# Patient Record
Sex: Female | Born: 1935 | Race: White | Hispanic: No | Marital: Married | Smoking: Never smoker
Health system: Southern US, Community
[De-identification: ages and names within clinical notes are randomized; demographics above are authoritative.]

## PROBLEM LIST (undated history)

## (undated) DIAGNOSIS — E785 Hyperlipidemia, unspecified: Secondary | ICD-10-CM

## (undated) DIAGNOSIS — G43909 Migraine, unspecified, not intractable, without status migrainosus: Secondary | ICD-10-CM

## (undated) HISTORY — DX: Hyperlipidemia, unspecified: E78.5

## (undated) HISTORY — PX: BLEPHAROPLASTY: SUR158

## (undated) HISTORY — DX: Migraine, unspecified, not intractable, without status migrainosus: G43.909

---

## 2000-10-04 LAB — HM COLONOSCOPY

## 2002-03-12 ENCOUNTER — Encounter: Payer: Self-pay | Admitting: Internal Medicine

## 2002-03-12 ENCOUNTER — Encounter: Admission: RE | Admit: 2002-03-12 | Discharge: 2002-03-12 | Payer: Self-pay | Admitting: Internal Medicine

## 2003-02-26 ENCOUNTER — Encounter: Payer: Self-pay | Admitting: Internal Medicine

## 2003-02-26 ENCOUNTER — Encounter: Admission: RE | Admit: 2003-02-26 | Discharge: 2003-02-26 | Payer: Self-pay | Admitting: Internal Medicine

## 2003-03-05 ENCOUNTER — Other Ambulatory Visit: Admission: RE | Admit: 2003-03-05 | Discharge: 2003-03-05 | Payer: Self-pay | Admitting: Internal Medicine

## 2003-08-26 ENCOUNTER — Ambulatory Visit (HOSPITAL_BASED_OUTPATIENT_CLINIC_OR_DEPARTMENT_OTHER): Admission: RE | Admit: 2003-08-26 | Discharge: 2003-08-26 | Payer: Self-pay | Admitting: Orthopedic Surgery

## 2004-04-11 ENCOUNTER — Encounter: Admission: RE | Admit: 2004-04-11 | Discharge: 2004-04-11 | Payer: Self-pay | Admitting: Internal Medicine

## 2004-07-20 ENCOUNTER — Ambulatory Visit (HOSPITAL_BASED_OUTPATIENT_CLINIC_OR_DEPARTMENT_OTHER): Admission: RE | Admit: 2004-07-20 | Discharge: 2004-07-20 | Payer: Self-pay | Admitting: Orthopedic Surgery

## 2004-10-12 ENCOUNTER — Ambulatory Visit: Payer: Self-pay | Admitting: Internal Medicine

## 2005-04-13 ENCOUNTER — Encounter: Admission: RE | Admit: 2005-04-13 | Discharge: 2005-04-13 | Payer: Self-pay | Admitting: Obstetrics and Gynecology

## 2005-04-25 ENCOUNTER — Encounter: Admission: RE | Admit: 2005-04-25 | Discharge: 2005-04-25 | Payer: Self-pay | Admitting: Obstetrics and Gynecology

## 2005-05-11 ENCOUNTER — Ambulatory Visit: Payer: Self-pay | Admitting: Family Medicine

## 2005-10-31 ENCOUNTER — Ambulatory Visit: Payer: Self-pay | Admitting: Family Medicine

## 2006-02-05 ENCOUNTER — Ambulatory Visit: Payer: Self-pay | Admitting: Family Medicine

## 2006-04-19 ENCOUNTER — Encounter: Admission: RE | Admit: 2006-04-19 | Discharge: 2006-04-19 | Payer: Self-pay | Admitting: Obstetrics and Gynecology

## 2006-05-30 ENCOUNTER — Ambulatory Visit: Payer: Self-pay | Admitting: Family Medicine

## 2006-07-27 ENCOUNTER — Ambulatory Visit: Payer: Self-pay | Admitting: Family Medicine

## 2006-07-27 LAB — CONVERTED CEMR LAB
ALT: 23 units/L (ref 0–40)
AST: 25 units/L (ref 0–37)
BUN: 14 mg/dL (ref 6–23)
Basophils Absolute: 0 10*3/uL (ref 0.0–0.1)
CO2: 29 meq/L (ref 19–32)
Calcium: 10.4 mg/dL (ref 8.4–10.5)
Creatinine, Ser: 0.9 mg/dL (ref 0.4–1.2)
Eosinophil percent: 1.4 % (ref 0.0–5.0)
Glomerular Filtration Rate, Af Am: 80 mL/min/{1.73_m2}
Lymphocytes Relative: 35.1 % (ref 12.0–46.0)
MCHC: 33.3 g/dL (ref 30.0–36.0)
MCV: 95.5 fL (ref 78.0–100.0)
Neutro Abs: 2.5 10*3/uL (ref 1.4–7.7)
Neutrophils Relative %: 56.2 % (ref 43.0–77.0)
Platelets: 272 10*3/uL (ref 150–400)
Potassium: 4 meq/L (ref 3.5–5.1)
RDW: 12.7 % (ref 11.5–14.6)
Sodium: 141 meq/L (ref 135–145)
Total Protein: 7.1 g/dL (ref 6.0–8.3)

## 2006-10-19 ENCOUNTER — Ambulatory Visit: Payer: Self-pay | Admitting: Family Medicine

## 2006-10-22 ENCOUNTER — Encounter: Admission: RE | Admit: 2006-10-22 | Discharge: 2006-10-22 | Payer: Self-pay | Admitting: Family Medicine

## 2006-10-31 ENCOUNTER — Ambulatory Visit: Payer: Self-pay | Admitting: Family Medicine

## 2006-10-31 LAB — CONVERTED CEMR LAB
ALT: 22 units/L (ref 0–40)
Albumin: 3.9 g/dL (ref 3.5–5.2)
CO2: 31 meq/L (ref 19–32)
Calcium: 9.9 mg/dL (ref 8.4–10.5)
Creatinine, Ser: 0.8 mg/dL (ref 0.4–1.2)
GFR calc Af Amer: 92 mL/min
GFR calc non Af Amer: 76 mL/min
Glucose, Bld: 96 mg/dL (ref 70–99)
Sodium: 144 meq/L (ref 135–145)
Total Bilirubin: 1 mg/dL (ref 0.3–1.2)

## 2006-11-20 ENCOUNTER — Encounter: Admission: RE | Admit: 2006-11-20 | Discharge: 2006-11-20 | Payer: Self-pay | Admitting: Family Medicine

## 2006-12-07 ENCOUNTER — Ambulatory Visit: Payer: Self-pay | Admitting: Family Medicine

## 2006-12-10 ENCOUNTER — Ambulatory Visit: Payer: Self-pay | Admitting: Cardiology

## 2006-12-10 ENCOUNTER — Ambulatory Visit: Payer: Self-pay | Admitting: Internal Medicine

## 2006-12-10 LAB — CONVERTED CEMR LAB
Protein, ur: NEGATIVE mg/dL
Urobilinogen, UA: 0.2 (ref 0.0–1.0)
pH: 6 (ref 5.0–8.0)

## 2007-01-21 DIAGNOSIS — G43909 Migraine, unspecified, not intractable, without status migrainosus: Secondary | ICD-10-CM | POA: Insufficient documentation

## 2007-01-29 ENCOUNTER — Ambulatory Visit: Payer: Self-pay | Admitting: Family Medicine

## 2007-01-29 DIAGNOSIS — D179 Benign lipomatous neoplasm, unspecified: Secondary | ICD-10-CM | POA: Insufficient documentation

## 2007-02-11 ENCOUNTER — Telehealth (INDEPENDENT_AMBULATORY_CARE_PROVIDER_SITE_OTHER): Payer: Self-pay | Admitting: *Deleted

## 2007-02-19 ENCOUNTER — Ambulatory Visit: Payer: Self-pay | Admitting: Family Medicine

## 2007-02-20 ENCOUNTER — Encounter: Payer: Self-pay | Admitting: Family Medicine

## 2007-03-20 ENCOUNTER — Telehealth (INDEPENDENT_AMBULATORY_CARE_PROVIDER_SITE_OTHER): Payer: Self-pay | Admitting: *Deleted

## 2007-04-04 ENCOUNTER — Encounter: Payer: Self-pay | Admitting: Internal Medicine

## 2007-04-24 ENCOUNTER — Encounter: Admission: RE | Admit: 2007-04-24 | Discharge: 2007-04-24 | Payer: Self-pay | Admitting: Obstetrics and Gynecology

## 2007-04-24 ENCOUNTER — Encounter: Payer: Self-pay | Admitting: Family Medicine

## 2007-05-02 ENCOUNTER — Encounter (INDEPENDENT_AMBULATORY_CARE_PROVIDER_SITE_OTHER): Payer: Self-pay | Admitting: *Deleted

## 2007-07-31 ENCOUNTER — Telehealth (INDEPENDENT_AMBULATORY_CARE_PROVIDER_SITE_OTHER): Payer: Self-pay | Admitting: *Deleted

## 2007-08-01 ENCOUNTER — Telehealth (INDEPENDENT_AMBULATORY_CARE_PROVIDER_SITE_OTHER): Payer: Self-pay | Admitting: *Deleted

## 2007-08-26 ENCOUNTER — Ambulatory Visit: Payer: Self-pay | Admitting: Family Medicine

## 2007-08-26 ENCOUNTER — Encounter (INDEPENDENT_AMBULATORY_CARE_PROVIDER_SITE_OTHER): Payer: Self-pay | Admitting: *Deleted

## 2007-09-10 ENCOUNTER — Telehealth (INDEPENDENT_AMBULATORY_CARE_PROVIDER_SITE_OTHER): Payer: Self-pay | Admitting: *Deleted

## 2007-11-05 ENCOUNTER — Ambulatory Visit: Payer: Self-pay | Admitting: Family Medicine

## 2007-11-05 DIAGNOSIS — E785 Hyperlipidemia, unspecified: Secondary | ICD-10-CM

## 2007-11-08 ENCOUNTER — Encounter (INDEPENDENT_AMBULATORY_CARE_PROVIDER_SITE_OTHER): Payer: Self-pay | Admitting: *Deleted

## 2007-11-18 ENCOUNTER — Encounter (INDEPENDENT_AMBULATORY_CARE_PROVIDER_SITE_OTHER): Payer: Self-pay | Admitting: *Deleted

## 2008-02-14 ENCOUNTER — Telehealth (INDEPENDENT_AMBULATORY_CARE_PROVIDER_SITE_OTHER): Payer: Self-pay | Admitting: *Deleted

## 2008-02-14 ENCOUNTER — Ambulatory Visit: Payer: Self-pay | Admitting: Family Medicine

## 2008-02-17 ENCOUNTER — Encounter (INDEPENDENT_AMBULATORY_CARE_PROVIDER_SITE_OTHER): Payer: Self-pay | Admitting: *Deleted

## 2008-02-18 ENCOUNTER — Encounter: Payer: Self-pay | Admitting: Family Medicine

## 2008-02-24 ENCOUNTER — Encounter (INDEPENDENT_AMBULATORY_CARE_PROVIDER_SITE_OTHER): Payer: Self-pay | Admitting: *Deleted

## 2008-04-29 ENCOUNTER — Encounter: Admission: RE | Admit: 2008-04-29 | Discharge: 2008-04-29 | Payer: Self-pay | Admitting: Obstetrics and Gynecology

## 2008-05-28 ENCOUNTER — Ambulatory Visit: Payer: Self-pay | Admitting: Family Medicine

## 2008-05-29 ENCOUNTER — Encounter: Payer: Self-pay | Admitting: Family Medicine

## 2008-06-01 ENCOUNTER — Encounter (INDEPENDENT_AMBULATORY_CARE_PROVIDER_SITE_OTHER): Payer: Self-pay | Admitting: *Deleted

## 2008-06-01 LAB — CONVERTED CEMR LAB
AST: 24 units/L (ref 0–37)
Albumin: 4 g/dL (ref 3.5–5.2)

## 2008-06-02 ENCOUNTER — Encounter: Payer: Self-pay | Admitting: Family Medicine

## 2008-06-11 ENCOUNTER — Encounter (INDEPENDENT_AMBULATORY_CARE_PROVIDER_SITE_OTHER): Payer: Self-pay | Admitting: *Deleted

## 2008-11-06 ENCOUNTER — Ambulatory Visit: Payer: Self-pay | Admitting: Family Medicine

## 2008-11-09 ENCOUNTER — Encounter (INDEPENDENT_AMBULATORY_CARE_PROVIDER_SITE_OTHER): Payer: Self-pay | Admitting: *Deleted

## 2009-02-17 ENCOUNTER — Ambulatory Visit: Payer: Self-pay | Admitting: Family Medicine

## 2009-02-17 DIAGNOSIS — M533 Sacrococcygeal disorders, not elsewhere classified: Secondary | ICD-10-CM | POA: Insufficient documentation

## 2009-02-18 ENCOUNTER — Encounter: Payer: Self-pay | Admitting: Family Medicine

## 2009-05-25 ENCOUNTER — Encounter: Admission: RE | Admit: 2009-05-25 | Discharge: 2009-05-25 | Payer: Self-pay | Admitting: Obstetrics and Gynecology

## 2009-06-14 ENCOUNTER — Ambulatory Visit: Payer: Self-pay | Admitting: Family Medicine

## 2009-06-16 ENCOUNTER — Encounter: Payer: Self-pay | Admitting: Family Medicine

## 2009-06-16 LAB — CONVERTED CEMR LAB
Alkaline Phosphatase: 73 units/L (ref 39–117)
LDL Cholesterol: 66 mg/dL (ref 0–99)
Total Bilirubin: 1 mg/dL (ref 0.3–1.2)
Total CHOL/HDL Ratio: 2

## 2009-10-05 ENCOUNTER — Telehealth: Payer: Self-pay | Admitting: Family Medicine

## 2009-11-10 ENCOUNTER — Ambulatory Visit: Payer: Self-pay | Admitting: Family Medicine

## 2009-11-10 ENCOUNTER — Encounter (INDEPENDENT_AMBULATORY_CARE_PROVIDER_SITE_OTHER): Payer: Self-pay | Admitting: *Deleted

## 2009-11-10 DIAGNOSIS — I498 Other specified cardiac arrhythmias: Secondary | ICD-10-CM

## 2009-11-16 ENCOUNTER — Ambulatory Visit: Payer: Self-pay | Admitting: Family Medicine

## 2010-03-22 ENCOUNTER — Ambulatory Visit (HOSPITAL_COMMUNITY): Admission: RE | Admit: 2010-03-22 | Discharge: 2010-03-22 | Payer: Self-pay | Admitting: Obstetrics and Gynecology

## 2010-03-23 ENCOUNTER — Telehealth (INDEPENDENT_AMBULATORY_CARE_PROVIDER_SITE_OTHER): Payer: Self-pay | Admitting: *Deleted

## 2010-03-23 DIAGNOSIS — Z780 Asymptomatic menopausal state: Secondary | ICD-10-CM | POA: Insufficient documentation

## 2010-05-25 ENCOUNTER — Ambulatory Visit: Payer: Self-pay | Admitting: Family Medicine

## 2010-05-27 ENCOUNTER — Encounter: Admission: RE | Admit: 2010-05-27 | Discharge: 2010-05-27 | Payer: Self-pay | Admitting: Obstetrics and Gynecology

## 2010-05-27 LAB — CONVERTED CEMR LAB
Albumin: 4.2 g/dL (ref 3.5–5.2)
Cholesterol: 162 mg/dL (ref 0–200)
Total Protein: 6.7 g/dL (ref 6.0–8.3)

## 2010-08-17 ENCOUNTER — Ambulatory Visit
Admission: RE | Admit: 2010-08-17 | Discharge: 2010-08-17 | Payer: Self-pay | Source: Home / Self Care | Attending: Family Medicine | Admitting: Family Medicine

## 2010-09-18 LAB — CONVERTED CEMR LAB
ALT: 19 units/L (ref 0–35)
ALT: 20 units/L (ref 0–35)
ALT: 20 units/L (ref 0–35)
AST: 22 units/L (ref 0–37)
AST: 24 units/L (ref 0–37)
AST: 24 units/L (ref 0–37)
Albumin: 3.9 g/dL (ref 3.5–5.2)
Albumin: 4 g/dL (ref 3.5–5.2)
Albumin: 4.2 g/dL (ref 3.5–5.2)
Albumin: 4.2 g/dL (ref 3.5–5.2)
Alkaline Phosphatase: 70 units/L (ref 39–117)
Alkaline Phosphatase: 70 units/L (ref 39–117)
Alkaline Phosphatase: 72 units/L (ref 39–117)
Alkaline Phosphatase: 79 units/L (ref 39–117)
Alkaline Phosphatase: 84 units/L (ref 39–117)
BUN: 12 mg/dL (ref 6–23)
BUN: 17 mg/dL (ref 6–23)
Basophils Absolute: 0 10*3/uL (ref 0.0–0.1)
Basophils Absolute: 0.1 10*3/uL (ref 0.0–0.1)
Basophils Relative: 0.6 % (ref 0.0–3.0)
Bilirubin, Direct: 0 mg/dL (ref 0.0–0.3)
Bilirubin, Direct: 0 mg/dL (ref 0.0–0.3)
Blood in Urine, dipstick: NEGATIVE
CO2: 30 meq/L (ref 19–32)
CO2: 31 meq/L (ref 19–32)
Calcium: 10.6 mg/dL — ABNORMAL HIGH (ref 8.4–10.5)
Chloride: 108 meq/L (ref 96–112)
Chloride: 108 meq/L (ref 96–112)
Cholesterol: 157 mg/dL (ref 0–200)
Creatinine, Ser: 0.8 mg/dL (ref 0.4–1.2)
Eosinophils Absolute: 0.1 10*3/uL (ref 0.0–0.7)
Eosinophils Relative: 0.9 % (ref 0.0–5.0)
Eosinophils Relative: 1.7 % (ref 0.0–5.0)
Free T4: 0.8 ng/dL (ref 0.6–1.6)
GFR calc non Af Amer: 75.27 mL/min (ref >60)
GFR calc non Af Amer: 76 mL/min
Glucose, Bld: 88 mg/dL (ref 70–99)
Glucose, Urine, Semiquant: NEGATIVE
HCT: 42.4 % (ref 36.0–46.0)
HCT: 44 % (ref 36.0–46.0)
HCT: 45.5 % (ref 36.0–46.0)
Hemoglobin: 14.8 g/dL (ref 12.0–15.0)
Hemoglobin: 14.9 g/dL (ref 12.0–15.0)
Hemoglobin: 14.9 g/dL (ref 12.0–15.0)
Ketones, urine, test strip: NEGATIVE
LDL Cholesterol: 69 mg/dL (ref 0–99)
Lymphocytes Relative: 33.1 % (ref 12.0–46.0)
Lymphocytes Relative: 34.1 % (ref 12.0–46.0)
Lymphs Abs: 1.6 10*3/uL (ref 0.7–4.0)
MCHC: 35 g/dL (ref 30.0–36.0)
MCV: 92.7 fL (ref 78.0–100.0)
MCV: 95.9 fL (ref 78.0–100.0)
Monocytes Absolute: 0.4 10*3/uL (ref 0.1–1.0)
Monocytes Relative: 7 % (ref 3.0–12.0)
Neutro Abs: 3.4 10*3/uL (ref 1.4–7.7)
Neutrophils Relative %: 56.6 % (ref 43.0–77.0)
Neutrophils Relative %: 57.6 % (ref 43.0–77.0)
Neutrophils Relative %: 62.8 % (ref 43.0–77.0)
Nitrite: NEGATIVE
Nitrite: NEGATIVE
Platelets: 250 10*3/uL (ref 150.0–400.0)
Platelets: 294 10*3/uL (ref 150–400)
Potassium: 4 meq/L (ref 3.5–5.1)
Potassium: 5.3 meq/L — ABNORMAL HIGH (ref 3.5–5.1)
Protein, U semiquant: NEGATIVE
RBC: 4.74 M/uL (ref 3.87–5.11)
RDW: 12.2 % (ref 11.5–14.6)
Specific Gravity, Urine: <1.005
T3, Free: 2.7 pg/mL (ref 2.3–4.2)
TSH: 1.23 microintl units/mL (ref 0.35–5.50)
Total Bilirubin: 0.5 mg/dL (ref 0.3–1.2)
Total Bilirubin: 0.7 mg/dL (ref 0.3–1.2)
Total Bilirubin: 1.1 mg/dL (ref 0.3–1.2)
Triglycerides: 67 mg/dL (ref 0.0–149.0)
Urobilinogen, UA: NEGATIVE
WBC Urine, dipstick: NEGATIVE
WBC: 4.8 10*3/uL (ref 4.5–10.5)
WBC: 4.9 10*3/uL (ref 4.5–10.5)
WBC: 5.3 10*3/uL (ref 4.5–10.5)
pH: 7

## 2010-09-20 NOTE — Assessment & Plan Note (Signed)
Summary: CPX/KDC   Vital Signs:  Patient profile:   75 year old female Height:      67 inches Weight:      188 pounds BMI:     29.55 Temp:     97.9 degrees F oral Pulse rate:   70 / minute Pulse rhythm:   regular BP sitting:   126 / 74  (left arm) Cuff size:   regular  Vitals Entered By: Army Fossa CMA (November 10, 2009 8:40 AM) CC: Pt here for CPX- no complaints.    History of Present Illness: Pt here for cpe and labs.  No complaints.    Hyperlipidemia follow-up      This is a 75 year old woman who presents for Hyperlipidemia follow-up.  The patient denies muscle aches, GI upset, abdominal pain, flushing, itching, constipation, diarrhea, and fatigue.  The patient denies the following symptoms: chest pain/pressure, exercise intolerance, dypsnea, palpitations, syncope, and pedal edema.  Compliance with medications (by patient report) has been near 100%.  Dietary compliance has been good.  The patient reports exercising 3-4X per week.  Adjunctive measures currently used by the patient include ASA, fish oil supplements, limiting alcohol consumpton, and weight reduction.    Preventive Screening-Counseling & Management  Alcohol-Tobacco     Alcohol drinks/day: <1     Alcohol type: all     >5/day in last 3 mos: no     Alcohol Counseling: not indicated; use of alcohol is not excessive or problematic     Feels need to cut down: no     Feels annoyed by complaints: no     Feels guilty re: drinking: no     Needs 'eye opener' in am: no     Smoking Status: never     Passive Smoke Exposure: no  Caffeine-Diet-Exercise     Caffeine use/day: 2     Caffeine Counseling: not indicated; caffeine use is not excessive or problematic     Does Patient Exercise: yes     Type of exercise: aerobics and strength     Exercise (avg: min/session): 30-60     Times/week: 3     Exercise Counseling: not indicated; exercise is adequate  Hep-HIV-STD-Contraception     HIV Risk: no     Dental Visit-last  6 months yes     Dental Care Counseling: not indicated; dental care within six months     SBE monthly: no     SBE Education/Counseling: to perform regular SBE     Sun Exposure-Excessive: no     Sun Exposure Counseling: not indicated; sun exposure is acceptable  Safety-Violence-Falls     Seat Belt Use: yes     Seat Belt Counseling: not applicable     Firearms in the Home: no firearms in the home     Firearm Counseling: not applicable     Smoke Detectors: yes     Smoke Detector Counseling: no     Violence in the Home: no risk noted     Sexual Abuse: no     Fall Risk: no      Sexual History:  currently monogamous.    Current Medications (verified): 1)  Aspirin 81 Mg Tabs (Aspirin) 2)  Zocor 80 Mg Tabs (Simvastatin) .... Take One Tablet Daily 3)  A-Fish Oil  Caps (Vitamin A Caps) 4)  Glucosamine Sulfate 750 Mg Caps (Glucosamine Sulfate) 5)  Daily Multiple Vitamins  Tabs (Multiple Vitamin) 6)  Maxalt 10 Mg Tabs (Rizatriptan Benzoate) .... As Needed 7)  Claritin 10 Mg  Tabs (Loratadine) .Marland Kitchen.. 1 By Mouth Once Daily 8)  Tums Ultra 1000 1000 Mg Chew (Calcium Carbonate Antacid) .Marland Kitchen.. 1 By Mouth Once Daily  Allergies: 1)  ! Astelin (Azelastine Hcl)  Past History:  Past Medical History: Last updated: 11/05/2007 Migraines Hyperlipidemia  Family History: Last updated: 11/10/2009 Family History of Arthritis Family History Diabetes 1st degree relative Family History Hypertension Family History of Melanoma Family History of CAD Female 1st degree relative <50--- F MI 88 M-- died 27/73 yo--  GI bleed  Social History: Last updated: 11/10/2009 Never Smoked Alcohol use-yes Drug use-no Regular exercise-yes Retired  Risk Factors: Alcohol Use: <1 (11/10/2009) >5 drinks/d w/in last 3 months: no (11/10/2009) Caffeine Use: 2 (11/10/2009) Exercise: yes (11/10/2009)  Risk Factors: Smoking Status: never (11/10/2009) Passive Smoke Exposure: no (11/10/2009)  Past Surgical  History: Current Problems:  LIPOMA NOS (ICD-214.9) FAMILY HISTORY OF MELANOMA (ICD-V16.8) FAMILY HISTORY DIABETES 1ST DEGREE RELATIVE (ICD-V18.0) MIGRAINE HEADACHE (ICD-346.90)  blepharoplasty,  brow lift--06/25/2009--[facility]  Family History: Reviewed history from 11/05/2007 and no changes required. Family History of Arthritis Family History Diabetes 1st degree relative Family History Hypertension Family History of Melanoma Family History of CAD Female 1st degree relative <50--- F MI 56 M-- died 33/73 yo--  GI bleed  Social History: Reviewed history from 11/05/2007 and no changes required. Never Smoked Alcohol use-yes Drug use-no Regular exercise-yes Retired  Review of Systems      See HPI General:  Denies chills, fatigue, fever, loss of appetite, malaise, sleep disorder, sweats, weakness, and weight loss. Eyes:  Denies blurring, discharge, double vision, eye irritation, eye pain, halos, itching, light sensitivity, red eye, vision loss-1 eye, and vision loss-both eyes. ENT:  Denies decreased hearing, difficulty swallowing, ear discharge, earache, hoarseness, nasal congestion, nosebleeds, postnasal drainage, ringing in ears, sinus pressure, and sore throat; optho-q1y. CV:  Denies bluish discoloration of lips or nails, chest pain or discomfort, difficulty breathing at night, difficulty breathing while lying down, fainting, fatigue, leg cramps with exertion, lightheadness, near fainting, palpitations, shortness of breath with exertion, swelling of feet, swelling of hands, and weight gain. Resp:  Denies chest discomfort, chest pain with inspiration, cough, coughing up blood, excessive snoring, hypersomnolence, morning headaches, pleuritic, shortness of breath, sputum productive, and wheezing. GI:  Denies abdominal pain, bloody stools, change in bowel habits, constipation, dark tarry stools, diarrhea, excessive appetite, gas, hemorrhoids, indigestion, loss of appetite, nausea,  vomiting, vomiting blood, and yellowish skin color. GU:  Denies abnormal vaginal bleeding, decreased libido, discharge, dysuria, genital sores, hematuria, incontinence, nocturia, urinary frequency, and urinary hesitancy. MS:  Denies joint pain, joint redness, joint swelling, loss of strength, low back pain, mid back pain, muscle aches, muscle , cramps, muscle weakness, stiffness, and thoracic pain. Derm:  Denies changes in color of skin, changes in nail beds, dryness, excessive perspiration, flushing, hair loss, insect bite(s), itching, lesion(s), poor wound healing, and rash. Neuro:  Denies brief paralysis, difficulty with concentration, disturbances in coordination, falling down, headaches, inability to speak, memory loss, numbness, poor balance, seizures, sensation of room spinning, tingling, tremors, visual disturbances, and weakness. Psych:  Denies alternate hallucination ( auditory/visual), anxiety, depression, easily angered, easily tearful, irritability, mental problems, panic attacks, sense of great danger, suicidal thoughts/plans, thoughts of violence, unusual visions or sounds, and thoughts /plans of harming others. Endo:  Denies cold intolerance, excessive hunger, excessive thirst, excessive urination, heat intolerance, polyuria, and weight change. Heme:  Denies abnormal bruising, bleeding, enlarge lymph nodes, fevers, pallor, and skin discoloration. Allergy:  Denies hives or rash, itching eyes, persistent infections, seasonal allergies, and sneezing.  Physical Exam  General:  Well-developed,well-nourished,in no acute distress; alert,appropriate and cooperative throughout examination Head:  Normocephalic and atraumatic without obvious abnormalities. No apparent alopecia or balding. Eyes:  vision grossly intact, pupils equal, pupils round, pupils reactive to light, and no injection.   Ears:  External ear exam shows no significant lesions or deformities.  Otoscopic examination reveals clear  canals, tympanic membranes are intact bilaterally without bulging, retraction, inflammation or discharge. Hearing is grossly normal bilaterally. Nose:  External nasal examination shows no deformity or inflammation. Nasal mucosa are pink and moist without lesions or exudates. Mouth:  Oral mucosa and oropharynx without lesions or exudates.  Teeth in good repair. Neck:  No deformities, masses, or tenderness noted.no carotid bruits.   Chest Wall:  No deformities, masses, or tenderness noted. Breasts:  No mass, nodules, thickening, tenderness, bulging, retraction, inflamation, nipple discharge or skin changes noted.   Lungs:  Normal respiratory effort, chest expands symmetrically. Lungs are clear to auscultation, no crackles or wheezes. Heart:  normal rate and no murmur.   Abdomen:  Bowel sounds positive,abdomen soft and non-tender without masses, organomegaly or hernias noted. Msk:  normal ROM, no joint tenderness, no joint swelling, no joint warmth, no redness over joints, no joint deformities, no joint instability, and no crepitation.   Pulses:  R posterior tibial normal, R dorsalis pedis normal, R carotid normal, L posterior tibial normal, L dorsalis pedis normal, and L carotid normal.   Extremities:  No clubbing, cyanosis, edema, or deformity noted with normal full range of motion of all joints.   Neurologic:  No cranial nerve deficits noted. Station and gait are normal. Plantar reflexes are down-going bilaterally. DTRs are symmetrical throughout. Sensory, motor and coordinative functions appear intact. Skin:  Intact without suspicious lesions or rashes Cervical Nodes:  No lymphadenopathy noted Axillary Nodes:  No palpable lymphadenopathy Psych:  Cognition and judgment appear intact. Alert and cooperative with normal attention span and concentration. No apparent delusions, illusions, hallucinations   Impression & Recommendations:  Problem # 1:  PREVENTIVE HEALTH CARE  (ICD-V70.0)  Orders: First annual wellness visit with prevention plan  (V4098) EKG w/ Interpretation (93000) Venipuncture (11914) TLB-Lipid Panel (80061-LIPID) TLB-BMP (Basic Metabolic Panel-BMET) (80048-METABOL) TLB-CBC Platelet - w/Differential (85025-CBCD) TLB-Hepatic/Liver Function Pnl (80076-HEPATIC) TLB-TSH (Thyroid Stimulating Hormone) (84443-TSH) TLB-T3, Free (Triiodothyronine) (84481-T3FREE) TLB-T4 (Thyrox), Free (78295-AO1H) UA Dipstick w/o Micro (manual) (08657)  Problem # 2:  HYPERLIPIDEMIA (ICD-272.4)  Her updated medication list for this problem includes:    Zocor 80 Mg Tabs (Simvastatin) .Marland Kitchen... Take one tablet daily  Orders: First annual wellness visit with prevention plan  (Q4696) EKG w/ Interpretation (93000) Venipuncture (29528) TLB-Lipid Panel (80061-LIPID) TLB-BMP (Basic Metabolic Panel-BMET) (80048-METABOL) TLB-CBC Platelet - w/Differential (85025-CBCD) TLB-Hepatic/Liver Function Pnl (80076-HEPATIC) TLB-TSH (Thyroid Stimulating Hormone) (84443-TSH) TLB-T3, Free (Triiodothyronine) (84481-T3FREE) TLB-T4 (Thyrox), Free [phone])  Labs Reviewed: SGOT: 26 (06/14/2009)   SGPT: 19 (06/14/2009)   HDL:59.00 (06/14/2009), 66.00 (11/06/2008)  LDL:66 (06/14/2009), 63 (11/06/2008)  Chol:145 (06/14/2009), 142 (11/06/2008)  Trig:100.0 (06/14/2009), 67.0 (11/06/2008)  Complete Medication List: 1)  Aspirin 81 Mg Tabs (Aspirin) 2)  Zocor 80 Mg Tabs (Simvastatin) .... Take one tablet daily 3)  A-fish Oil Caps (Vitamin a caps) 4)  Glucosamine Sulfate 750 Mg Caps (Glucosamine sulfate) 5)  Daily Multiple Vitamins Tabs (Multiple vitamin) 6)  Maxalt 10 Mg Tabs (Rizatriptan benzoate) .... As needed 7)  Claritin 10 Mg Tabs (Loratadine) .Marland Kitchen.. 1 by mouth once daily 8)  Tums Ultra 1000 1000 Mg Chew (Calcium carbonate antacid) .Marland Kitchen.. 1 by mouth once daily Prescriptions: MAXALT 10 MG TABS (RIZATRIPTAN BENZOATE) as needed  #20 x 0   Entered and Authorized by:   Loreen Freud DO    Signed by:   Loreen Freud DO on 11/10/2009   Method used:   Electronically to        [facility]* (mail-order)             ,          Ph: [phone]       Fax: [phone]   RxID:   [id number] ZOCOR 80 MG TABS (SIMVASTATIN) TAKE ONE TABLET DAILY Brand medically necessary #90 x 3   Entered and Authorized by:   Loreen Freud DO   Signed by:   Loreen Freud DO on 11/10/2009   Method used:   Electronically to        [facility]* (mail-order)             ,          Ph: [phone]       Fax: [phone]   RxID:   [id number]    EKG  Procedure date:  11/10/2009  Findings:      Sinus bradycardia with rate of:  49 bpm low voltage   Last Flu Vaccine:  Fluvax MCR (05/25/2008 9:01:55 AM) Flu Vaccine Result Date:  06/09/2009 Flu Vaccine Result:  given Flu Vaccine Next Due:  1 yr Last Mammogram:  ASSESSMENT: Negative - BI-RADS 1^MM DIGITAL SCREENING (05/25/2009 8:50:00 AM) Mammogram Result Date:  05/25/2009 Mammogram Result:  normal Mammogram Next Due:  1 yr     Laboratory Results   Urine Tests   Date/Time Reported: November 10, 2009 10:55 AM   Routine Urinalysis   Color: lt. yellow Appearance: Clear Glucose: negative   (Normal Range: Negative) Bilirubin: negative   (Normal Range: Negative) Ketone: negative   (Normal Range: Negative) Spec. Gravity: 1.020   (Normal Range: 1.003-1.035) Blood: negative   (Normal Range: Negative) pH: 5.0   (Normal Range: 5.0-8.0) Protein: negative   (Normal Range: Negative) Urobilinogen: negative   (Normal Range: 0-1) Nitrite: negative   (Normal Range: Negative) Leukocyte Esterace: negative   (Normal Range: Negative)    Comments: Floydene Flock  November 10, 2009 10:55 AM

## 2010-09-20 NOTE — Progress Notes (Signed)
Summary: BONE DENSITY ORDER  Phone Note Call from Patient   Caller: Patient Summary of Call: PT CALLS AND WANTS TO GO TO THE [facility] FOR A BONE DENSITY TEST. NEEDS ORDER. Initial call taken by: Lavell Islam,  March 23, 2010 9:23 AM  Follow-up for Phone Call        done Follow-up by: Loreen Freud DO,  March 23, 2010 12:02 PM  Additional Follow-up for Phone Call Additional follow up Details #1::        orders faxed to breast center..........Marland KitchenFelecia Deloach CMA  March 23, 2010 12:18 PM   pt aware.........Marland KitchenFelecia Deloach CMA  March 23, 2010 12:19 PM   New Problems: POSTMENOPAUSAL STATUS (ICD-V49.81)   New Problems: POSTMENOPAUSAL STATUS (ICD-V49.81)

## 2010-09-20 NOTE — Progress Notes (Signed)
Summary: Cipro rx(lmom 2/15)  Phone Note Call from Patient   Summary of Call: Pt called and stated that she is going to [place] next week and you normally prescribe her Cipro- will you please advise with instructions.  Initial call taken by: Army Fossa CMA,  October 05, 2009 11:20 AM  Follow-up for Phone Call        cipro 500 mg 1 by mouth two times a day for 7 days  Follow-up by: Loreen Freud DO,  October 05, 2009 11:54 AM  Additional Follow-up for Phone Call Additional follow up Details #1::        LMTCB. Army Fossa CMA  October 05, 2009 12:04 PM     Additional Follow-up for Phone Call Additional follow up Details #2::    Patient Pharam[facility] is [facility] on [address]. Patient was given the directions of the medication...Marland KitchenMarland KitchenBarb Merino  October 05, 2009 3:25 PM  Follow-up by: Barb Merino,  October 05, 2009 3:25 PM  New/Updated Medications: CIPRO 500 MG TABS (CIPROFLOXACIN HCL) 1 by mouth two times a day for 7 days. Prescriptions: CIPRO 500 MG TABS (CIPROFLOXACIN HCL) 1 by mouth two times a day for 7 days.  #14 x 0   Entered by:   Army Fossa CMA   Authorized by:   Loreen Freud DO   Signed by:   Army Fossa CMA on 10/05/2009   Method used:   Electronically to        [facility] [address] (retail)       [address]       [place], [place]  [address]       Ph: [phone]       Fax: [phone]   RxID:   [id number]

## 2010-09-20 NOTE — Letter (Signed)
Summary: [facility]: Immunoassay Fecal Occult Blood (iFOB) Order Form  [facility] [place]  [address] [place], [place] [address]   Phone: [phone]  Fax: [phone]      [facility]: Immunoassay Fecal Occult Blood (iFOB) Order Form   November 10, 2009 MRN: [id number]   MINNETTE MERIDA 1936/06/13   Physicican Name:______Yvonne Lowne,DO___________________  Diagnosis Code:_______v76.51___________________      Army Fossa CMA

## 2010-09-22 NOTE — Assessment & Plan Note (Signed)
Summary: LEFT EAR STOPPED UP/RH......Marland Kitchen   Vital Signs:  Patient profile:   75 year old female Weight:      188 pounds BMI:     29.55 Temp:     97.0 degrees F oral BP sitting:   120 / 70  (left arm)  Vitals Entered By: Doristine Devoid CMA (August 17, 2010 11:00 AM) CC: L ear stopped x3 wks    History of Present Illness: 75 yo woman here today for L ear wax.  sxs started 3 weeks ago.  reports using Debrox ear drops and ear candles w/out results.  this is not a new problem for pt.  Current Medications (verified): 1)  Aspirin 81 Mg Tabs (Aspirin) 2)  Zocor 80 Mg Tabs (Simvastatin) .... Take One Tablet Daily 3)  Flaxseed Oil 1000 Mg Caps (Flaxseed (Linseed)) .... Take One Tablet Daily 4)  Glucosamine Sulfate 750 Mg Caps (Glucosamine Sulfate) 5)  Daily Multiple Vitamins  Tabs (Multiple Vitamin) 6)  Maxalt 10 Mg Tabs (Rizatriptan Benzoate) .... As Needed 7)  Claritin 10 Mg  Tabs (Loratadine) .Marland Kitchen.. 1 By Mouth Once Daily As Needed 8)  Tums Ultra 1000 1000 Mg Chew (Calcium Carbonate Antacid) .Marland Kitchen.. 1 By Mouth Once Daily  Allergies (verified): 1)  ! Astelin (Azelastine Hcl)  Review of Systems      See HPI  Physical Exam  General:  Well-developed,well-nourished,in no acute distress; alert,appropriate and cooperative throughout examination Ears:  R ear normal L TM obscured by wall of dark cerumen- unable to be curretted due to depth and proximity to TM.  softened w/ colace and peroxide and successfully irrigated.   Impression & Recommendations:  Problem # 1:  CERUMEN IMPACTION (ICD-380.4) Assessment New  pt's ear successfully irrigated and TM normal appearing.  pt appreciative, reports 'i can hear!'  Orders: Cerumen Impaction Removal ([address])  Complete Medication List: 1)  Aspirin 81 Mg Tabs (Aspirin) 2)  Zocor 80 Mg Tabs (Simvastatin) .... Take one tablet daily 3)  Flaxseed Oil 1000 Mg Caps (Flaxseed (linseed)) .... Take one tablet daily 4)  Glucosamine Sulfate 750 Mg Caps  (Glucosamine sulfate) 5)  Daily Multiple Vitamins Tabs (Multiple vitamin) 6)  Maxalt 10 Mg Tabs (Rizatriptan benzoate) .... As needed 7)  Claritin 10 Mg Tabs (Loratadine) .Marland Kitchen.. 1 by mouth once daily as needed 8)  Tums Ultra 1000 1000 Mg Chew (Calcium carbonate antacid) .Marland Kitchen.. 1 by mouth once daily   Orders Added: 1)  Est. Patient Level III [[address]] 2)  Cerumen Impaction Removal [09811]

## 2010-11-01 ENCOUNTER — Encounter: Payer: Self-pay | Admitting: Family Medicine

## 2010-11-05 LAB — CBC
MCHC: 34.8 g/dL (ref 30.0–36.0)
MCV: 93.9 fL (ref 78.0–100.0)
Platelets: 240 10*3/uL (ref 150–400)
RDW: 13.5 % (ref 11.5–15.5)
WBC: 5.1 10*3/uL (ref 4.0–10.5)

## 2010-11-14 ENCOUNTER — Encounter: Payer: Self-pay | Admitting: Family Medicine

## 2010-11-16 ENCOUNTER — Encounter: Payer: Self-pay | Admitting: Family Medicine

## 2010-11-16 ENCOUNTER — Ambulatory Visit (INDEPENDENT_AMBULATORY_CARE_PROVIDER_SITE_OTHER): Payer: Medicare Other | Admitting: Family Medicine

## 2010-11-16 VITALS — BP 140/82 | HR 60 | Ht 67.0 in | Wt 186.6 lb

## 2010-11-16 DIAGNOSIS — Z136 Encounter for screening for cardiovascular disorders: Secondary | ICD-10-CM

## 2010-11-16 DIAGNOSIS — Z780 Asymptomatic menopausal state: Secondary | ICD-10-CM

## 2010-11-16 DIAGNOSIS — E785 Hyperlipidemia, unspecified: Secondary | ICD-10-CM

## 2010-11-16 DIAGNOSIS — Z0000 Encounter for general adult medical examination without abnormal findings: Secondary | ICD-10-CM | POA: Insufficient documentation

## 2010-11-16 DIAGNOSIS — G43909 Migraine, unspecified, not intractable, without status migrainosus: Secondary | ICD-10-CM

## 2010-11-16 LAB — CBC WITH DIFFERENTIAL/PLATELET
Basophils Absolute: 0 10*3/uL (ref 0.0–0.1)
Eosinophils Absolute: 0.1 10*3/uL (ref 0.0–0.7)
Eosinophils Relative: 1 % (ref 0.0–5.0)
HCT: 44.4 % (ref 36.0–46.0)
Lymphs Abs: 1.4 10*3/uL (ref 0.7–4.0)
MCHC: 34.1 g/dL (ref 30.0–36.0)
MCV: 95.3 fl (ref 78.0–100.0)
Monocytes Absolute: 0.3 10*3/uL (ref 0.1–1.0)
Neutrophils Relative %: 69.9 % (ref 43.0–77.0)
Platelets: 229 10*3/uL (ref 150.0–400.0)
RDW: 13.7 % (ref 11.5–14.6)
WBC: 6 10*3/uL (ref 4.5–10.5)

## 2010-11-16 LAB — POCT URINALYSIS DIPSTICK
Leukocytes, UA: NEGATIVE
Nitrite, UA: NEGATIVE
Protein, UA: NEGATIVE
Urobilinogen, UA: NEGATIVE
pH, UA: 6.5

## 2010-11-16 LAB — HEPATIC FUNCTION PANEL
ALT: 21 U/L (ref 0–35)
AST: 22 U/L (ref 0–37)
Alkaline Phosphatase: 81 U/L (ref 39–117)
Total Bilirubin: 0.6 mg/dL (ref 0.3–1.2)

## 2010-11-16 LAB — LIPID PANEL: HDL: 65.5 mg/dL (ref >39.00)

## 2010-11-16 MED ORDER — ZOCOR 80 MG PO TABS: 80.0000 mg | ORAL_TABLET | Freq: Every day | ORAL | Status: DC

## 2010-11-16 NOTE — Progress Notes (Signed)
Addended by: Floydene Flock on: 11/16/2010 01:32 PM   Modules accepted: Orders

## 2010-11-16 NOTE — Progress Notes (Signed)
Subjective:    Patient ID: Kylie Herring, female    DOB: September 09, 1935, 75 y.o.   MRN: [id number]  HPI Pt here for cpe , no pap and fasting labs.   No complaints.      Review of Systems  Constitutional: Negative for activity change, appetite change and fatigue.  HENT: Negative for hearing loss, congestion, tinnitus and ear discharge.   Eyes: Negative for visual disturbance (see optho q1y -- vision corrected to 20/20 with glasses).  Respiratory: Negative for cough, chest tightness and shortness of breath.   Cardiovascular: Negative for chest pain, palpitations and leg swelling.  Gastrointestinal: Negative for abdominal pain, diarrhea, constipation and abdominal distention.  Genitourinary: Negative for urgency, frequency, decreased urine volume and difficulty urinating.  Musculoskeletal: Negative for back pain, arthralgias and gait problem.  Skin: Negative for color change, pallor and rash.  Neurological: Negative for dizziness, light-headedness, numbness and headaches.  Hematological: Negative for adenopathy. Does not bruise/bleed easily.  Psychiatric/Behavioral: Negative for suicidal ideas, confusion, sleep disturbance, self-injury, dysphoric mood, decreased concentration and agitation.  Pt is able to read and write and can do all ADLs No risk for falling No abuse/ violence in home       Objective:   Physical Exam        Assessment & Plan:   Subjective:     Kylie Herring is a 75 y.o. female and is here for a comprehensive physical exam. The patient reports no problems.  History   Social History  . Marital Status: Married    Spouse Name: N/A    Number of Children: N/A  . Years of Education: N/A   Occupational History  . retired    Social History Main Topics  . Smoking status: Never Smoker   . Smokeless tobacco: Not on file  . Alcohol Use: Yes  . Drug Use: No  . Sexually Active: Not on file   Other Topics Concern  . Not on file   Social History  Narrative  . No narrative on file   Health Maintenance  Topic Date Due  . Pneumococcal Polysaccharide Vaccine Age 39 And Over  05/16/2003  . Influenza Vaccine  05/21/2010  . Colonoscopy  10/04/2010  . Tetanus/tdap  11/01/2015  . Zostavax  Completed    The following portions of the patient's history were reviewed and updated as appropriate: allergies, past family history, past medical history, past social history, past surgical history and problem list.  Review of Systems A comprehensive review of systems was negative.  Derm --Dr [facility]--Dr Emily [facility]--[facility]-- [facility]-- Briant Cedar  Objective:    BP 140/82  Pulse 60  Ht 5\' 7"  (1.702 m)  Wt 186 lb 9.6 oz (84.641 kg)  BMI 29.23 kg/m2 General appearance: alert, cooperative, appears stated age and no distress Head: Normocephalic, without obvious abnormality, atraumatic, sinuses nontender to percussion Eyes: conjunctivae/corneas clear. PERRL, EOM's intact. Fundi benign. Ears: normal TM's and external ear canals both ears Nose: Nares normal. Septum midline. Mucosa normal. No drainage or sinus tenderness. Throat: lips, mucosa, and tongue normal; teeth and gums normal Neck: no adenopathy, no carotid bruit, no JVD, supple, symmetrical, trachea midline and thyroid not enlarged, symmetric, no tenderness/mass/nodules Lungs: clear to auscultation bilaterally Breasts: normal appearance, no masses or tenderness Heart: regular rate and rhythm, S1, S2 normal, no murmur, click, rub or gallop Abdomen: soft, non-tender; bowel sounds normal; no masses,  no organomegaly Extremities: extremities normal, atraumatic, no cyanosis or edema Pulses: 2+ and symmetric Skin: Skin  color, texture, turgor normal. No rashes or lesions Lymph nodes: Cervical, supraclavicular, and axillary nodes normal. Neurologic: Alert and oriented X 3, normal strength and tone. Normal symmetric reflexes. Normal coordination and gait Cranial nerves: normal      Assessment:    Healthy female exam.       Plan:     See After Visit Summary for Counseling Recommendations

## 2010-11-16 NOTE — Assessment & Plan Note (Signed)
Check labs  con't med 

## 2010-11-16 NOTE — Assessment & Plan Note (Signed)
Improved Has maxalt to use prn

## 2010-11-16 NOTE — Patient Instructions (Signed)
Things to do to Keep Yourself Healthy  Exercise at least 30 minutes a day, 5-7 days a week.  Eat a low-fat diet with lots of fruits and vegetables. Avoid obesity. Seatbelts can save your life. Wear them always. Smoke detectors on every level of your home, check batteries every year. Eye Doctor - have an eye exam regularly Safe sex - if you may be exposed to STDs, use a condom. Alcohol If you drink, do it moderately - 1 drink per day or less. Never drink and drive. Health Care Power of Attorney - choose one ... ask [place] for forms. Depression is common - If you're feeling down or losing interest in things you normally enjoy, please call us Violence - If anyone is threatening or hurting you, please call us

## 2010-11-16 NOTE — Assessment & Plan Note (Signed)
bmd reviewed Cont calcium

## 2010-11-18 ENCOUNTER — Encounter: Payer: Self-pay | Admitting: *Deleted

## 2010-11-18 NOTE — Progress Notes (Signed)
  Subjective:    Patient ID: Kylie Herring, female    DOB: 28-Jan-1936, 75 y.o.   MRN: [id number]  HPI    Review of Systems     Objective:   Physical Exam  Psychiatric: Cognition and memory are not impaired. She exhibits normal recent memory and normal remote memory.       AAOx3           Assessment & Plan:

## 2010-11-21 ENCOUNTER — Encounter: Payer: Self-pay | Admitting: *Deleted

## 2010-11-21 NOTE — Progress Notes (Signed)
Already reviewed.  

## 2010-11-21 NOTE — Progress Notes (Signed)
Letter mailed

## 2011-01-06 NOTE — Op Note (Signed)
NAME:  Kylie Herring, Kylie Herring NO.:  [id number]   MEDICAL RECORD NO.:  [id number]                   PATIENT TYPE:  AMB   LOCATION:  DSC                                  FACILITY:  [facility]   PHYSICIAN:  Cindee Salt, M.D.                    DATE OF BIRTH:  25-Aug-1935   DATE OF PROCEDURE:  08/25/2002  DATE OF DISCHARGE:                                 OPERATIVE REPORT   PREOPERATIVE DIAGNOSIS:  Stenosing tenosynovitis, right middle finger.   POSTOPERATIVE DIAGNOSIS:  Stenosing tenosynovitis, right middle ringer.   OPERATION:  Decompression release A1 pulley right middle finger.   SURGEON:  Cindee Salt, M.D.   ASSISTANT:   ANESTHESIA:  Forearm base, IV regional.   HISTORY:  The patient is a 75 year old female with a history of triggering  of her right middle finger which has not responded to conservative  treatment.   DESCRIPTION OF PROCEDURE:  The patient was brought to the operating room.  Forearm based IV regional anesthetic was carried out without difficulty.  She was prepped using DuraPrep in a supine position right arm free.  An  oblique incision was made over the A1 pulley right middle finger and carried  down through the subcutaneous tissue.  The bleeders were electrocauterized.  The neurovascular structures were identified and protected.  Dissection was  carried down to the A1 pulley.  This was released in its radial aspect  protecting the nerves.  A small incision was made in the A2 pulley distally.  Hypertrophic synovium was removed proximal to the A1 pulley.  The finger was  placed through a full range of motion.  No further triggering was  identified.  The wound was irrigated.  The skin was closed with interrupted  5-0 nylon sutures.  A sterile compressive dressing was applied.  The patient  tolerated the procedure well.  She was taken to the recovery room for  observation in satisfactory condition.  She is discharged home to return to  the [facility] [place] in one week on Vicodin and Keflex.                                               Cindee Salt, M.D.    Angelique Blonder  D:  08/26/2003  T:  08/26/2003  Job:  [id number]

## 2011-01-06 NOTE — Op Note (Signed)
NAME:  Kylie Herring, Kylie Herring NO.:  [id number]   MEDICAL RECORD NO.:  [id number]          PATIENT TYPE:  AMB   LOCATION:  DSC                          FACILITY:  [facility]   PHYSICIAN:  Cindee Salt, M.D.       DATE OF BIRTH:  1936-02-11   DATE OF PROCEDURE:  07/20/2004  DATE OF DISCHARGE:                                 OPERATIVE REPORT   PREOPERATIVE DIAGNOSIS:  Stenosing tenosynovitis, left middle finger.   POSTOPERATIVE DIAGNOSIS:  Stenosing tenosynovitis, left middle finger.   OPERATION:  Release A1 pulley, left middle finger.   SURGEON:  Cindee Salt, M.D.   ASSISTANTCarolyne Fiscal.   ANESTHESIA:  Forearm-based IV  regional.   HISTORY:  The patient is a 75 year old female with a history of triggering  of her left middle finger.  This has not responded to conservative  treatment.   PROCEDURE:  The patient is brought to the operating room, forearm-based IV  regional anesthetic was carried out without difficulty.  She was prepped  using DuraPrep in supine position and the left arm free.  An oblique  incision was made over the metacarpophalangeal joint of the left middle  finger palmarly, carried down through subcutaneous tissue.  The radial and  ulnar digital artery and nerve were identified and protected.  The A1 pulley  was isolated.  A release was then performed on the radial aspect.  An  incision was made centrally in the A2 pulley, the finger placed through a  full range of motion, and no further triggering was identified.  The wound  was irrigated. The skin was then closed with interrupted 5-0 nylon suture.  A sterile compressive dressing was applied.  The patient tolerated the  procedure well and was taken to the recovery room for observation in  satisfactory condition.  She is discharged home to return to the [facility] [place] in one week on Tylenol No. 3.       GK/MEDQ  D:  07/20/2004  T:  07/20/2004  Job:  [id number]

## 2011-04-05 ENCOUNTER — Other Ambulatory Visit: Payer: Self-pay | Admitting: Obstetrics and Gynecology

## 2011-04-05 DIAGNOSIS — Z1231 Encounter for screening mammogram for malignant neoplasm of breast: Secondary | ICD-10-CM

## 2011-05-31 ENCOUNTER — Other Ambulatory Visit: Payer: Self-pay | Admitting: Family Medicine

## 2011-05-31 ENCOUNTER — Ambulatory Visit
Admission: RE | Admit: 2011-05-31 | Discharge: 2011-05-31 | Disposition: A | Discharge status: Home / Self Care | Payer: Medicare Other | Source: Ambulatory Visit | Attending: Obstetrics and Gynecology | Admitting: Obstetrics and Gynecology

## 2011-05-31 DIAGNOSIS — Z1231 Encounter for screening mammogram for malignant neoplasm of breast: Secondary | ICD-10-CM

## 2011-05-31 DIAGNOSIS — E785 Hyperlipidemia, unspecified: Secondary | ICD-10-CM

## 2011-06-01 ENCOUNTER — Encounter: Payer: Self-pay | Admitting: *Deleted

## 2011-06-01 ENCOUNTER — Other Ambulatory Visit (INDEPENDENT_AMBULATORY_CARE_PROVIDER_SITE_OTHER): Payer: Medicare Other

## 2011-06-01 DIAGNOSIS — E785 Hyperlipidemia, unspecified: Secondary | ICD-10-CM

## 2011-06-01 LAB — LIPID PANEL: Cholesterol: 144 mg/dL (ref 0–200)

## 2011-06-01 NOTE — Progress Notes (Signed)
12  

## 2011-06-05 LAB — HEPATIC FUNCTION PANEL
ALT: 22 U/L (ref 0–35)
Alkaline Phosphatase: 69 U/L (ref 39–117)
Bilirubin, Direct: 0.1 mg/dL (ref 0.0–0.3)
Total Bilirubin: 0.3 mg/dL (ref 0.3–1.2)
Total Protein: 6.9 g/dL (ref 6.0–8.3)

## 2011-06-06 ENCOUNTER — Encounter: Payer: Self-pay | Admitting: *Deleted

## 2011-07-18 ENCOUNTER — Encounter: Payer: Self-pay | Admitting: Family Medicine

## 2011-07-18 ENCOUNTER — Ambulatory Visit (HOSPITAL_BASED_OUTPATIENT_CLINIC_OR_DEPARTMENT_OTHER)
Admission: RE | Admit: 2011-07-18 | Discharge: 2011-07-18 | Disposition: A | Discharge status: Home / Self Care | Payer: Medicare Other | Source: Ambulatory Visit | Attending: Family Medicine | Admitting: Family Medicine

## 2011-07-18 ENCOUNTER — Ambulatory Visit (INDEPENDENT_AMBULATORY_CARE_PROVIDER_SITE_OTHER): Payer: Medicare Other | Admitting: Family Medicine

## 2011-07-18 VITALS — BP 120/76 | HR 66 | Temp 98.8°F | Wt 189.2 lb

## 2011-07-18 DIAGNOSIS — M25549 Pain in joints of unspecified hand: Secondary | ICD-10-CM

## 2011-07-18 DIAGNOSIS — Z9181 History of falling: Secondary | ICD-10-CM | POA: Insufficient documentation

## 2011-07-18 DIAGNOSIS — W19XXXA Unspecified fall, initial encounter: Secondary | ICD-10-CM

## 2011-07-18 DIAGNOSIS — M79609 Pain in unspecified limb: Secondary | ICD-10-CM

## 2011-07-18 DIAGNOSIS — M79646 Pain in unspecified finger(s): Secondary | ICD-10-CM

## 2011-07-18 MED ORDER — AMBULATORY NON FORMULARY MEDICATION: Status: DC

## 2011-07-18 MED ORDER — MELOXICAM 15 MG PO TABS: 15.0000 mg | ORAL_TABLET | Freq: Every day | ORAL | Status: AC

## 2011-07-18 NOTE — Progress Notes (Signed)
  Subjective:    Patient ID: Kylie Herring, female    DOB: 1935-12-12, 75 y.o.   MRN: [id number]  HPI Pt here c/o L thumb pain after falling on outstretched hand about 5 weeks ago.  No other complaints. No otc meds.   Review of Systems    as above Objective:   Physical Exam  Constitutional: She appears well-developed and well-nourished.  Musculoskeletal: Normal range of motion. She exhibits no edema and no tenderness.        Pain only with movement of L thumb.   Psychiatric: She has a normal mood and affect. Her behavior is normal. Judgment and thought content normal.          Assessment & Plan:  L thumb sprain/ strain----check xray,  Thumb spica splint                                          Ice, nsaids To ortho if no relief

## 2011-07-18 NOTE — Patient Instructions (Signed)
Thumb Sprain Your exam shows you have a sprained thumb. This means the ligaments around the joint have been torn. Thumb sprains usually take 3-6 weeks to heal. However, severe, unstable sprains may need to be fixed surgically. Sometimes a small piece of bone is pulled off by the ligament. If this is not treated properly, a sprained thumb can lead to a painful, weak joint. Treatment helps reduce pain and shortens the period of disability. The thumb, and often the wrist, must remain splinted for the first 2-4 weeks to protect the joint. Keep your hand elevated and apply ice packs frequently to the injured area (20-30 minutes every 2-3 hours) for the next 2-4 days. This helps reduce swelling and control pain. Pain medicine may also be used for several days. Motion and strengthening exercises may later be prescribed for the joint to return to normal function. Be sure to see your doctor for follow-up because your thumb joint may require further support with splints, bandages or tape. Please see your doctor or go to the emergency room right away if you have increased pain despite proper treatment, or a numb, cold, or pale thumb. Document Released: 09/14/2004 Document Revised: 04/19/2011 Document Reviewed: 08/08/2008 [facility] Patient Information 2012 [place], [place].

## 2011-08-08 ENCOUNTER — Telehealth: Payer: Self-pay | Admitting: Family Medicine

## 2011-08-08 MED ORDER — CIPROFLOXACIN HCL 500 MG PO TABS: 500.0000 mg | ORAL_TABLET | Freq: Two times a day (BID) | ORAL | Status: AC

## 2011-08-08 NOTE — Telephone Encounter (Signed)
Patient aware Rx has been sent      KP 

## 2011-08-08 NOTE — Telephone Encounter (Signed)
cipro 500 mg 1 po bid for 5 days  

## 2011-08-08 NOTE — Telephone Encounter (Signed)
Please advise      KP 

## 2011-08-08 NOTE — Telephone Encounter (Signed)
Patient wants rx for cipro - she will be traveling to [place] after holliday

## 2011-11-17 ENCOUNTER — Encounter: Payer: Medicare Other | Admitting: Family Medicine

## 2011-11-20 ENCOUNTER — Ambulatory Visit (INDEPENDENT_AMBULATORY_CARE_PROVIDER_SITE_OTHER): Payer: Medicare Other | Admitting: Family Medicine

## 2011-11-20 ENCOUNTER — Encounter: Payer: Self-pay | Admitting: Family Medicine

## 2011-11-20 VITALS — BP 120/74 | HR 56 | Temp 98.1°F | Ht 66.25 in | Wt 190.4 lb

## 2011-11-20 DIAGNOSIS — Z0000 Encounter for general adult medical examination without abnormal findings: Secondary | ICD-10-CM

## 2011-11-20 DIAGNOSIS — Z23 Encounter for immunization: Secondary | ICD-10-CM

## 2011-11-20 DIAGNOSIS — G43909 Migraine, unspecified, not intractable, without status migrainosus: Secondary | ICD-10-CM

## 2011-11-20 DIAGNOSIS — E785 Hyperlipidemia, unspecified: Secondary | ICD-10-CM

## 2011-11-20 LAB — POCT URINALYSIS DIPSTICK
Blood, UA: NEGATIVE
Nitrite, UA: NEGATIVE
Protein, UA: NEGATIVE
Spec Grav, UA: <=1.005
Urobilinogen, UA: 0.2
pH, UA: 7.5

## 2011-11-20 LAB — LIPID PANEL
Cholesterol: 175 mg/dL (ref 0–200)
HDL: 68.2 mg/dL (ref >39.00)
LDL Cholesterol: 91 mg/dL (ref 0–99)
Total CHOL/HDL Ratio: 3
Triglycerides: 77 mg/dL (ref 0.0–149.0)
VLDL: 15.4 mg/dL (ref 0.0–40.0)

## 2011-11-20 LAB — CBC WITH DIFFERENTIAL/PLATELET
Basophils Absolute: 0 10*3/uL (ref 0.0–0.1)
Eosinophils Relative: 1.3 % (ref 0.0–5.0)
HCT: 43.1 % (ref 36.0–46.0)
Lymphs Abs: 1.4 10*3/uL (ref 0.7–4.0)
MCV: 93.6 fl (ref 78.0–100.0)
Monocytes Absolute: 0.3 10*3/uL (ref 0.1–1.0)
Neutrophils Relative %: 63.7 % (ref 43.0–77.0)
Platelets: 217 10*3/uL (ref 150.0–400.0)
RDW: 14.1 % (ref 11.5–14.6)
WBC: 5 10*3/uL (ref 4.5–10.5)

## 2011-11-20 LAB — HEPATIC FUNCTION PANEL
Bilirubin, Direct: 0.1 mg/dL (ref 0.0–0.3)
Total Bilirubin: 0.6 mg/dL (ref 0.3–1.2)

## 2011-11-20 LAB — BASIC METABOLIC PANEL
BUN: 19 mg/dL (ref 6–23)
Chloride: 107 mEq/L (ref 96–112)
Creatinine, Ser: 0.7 mg/dL (ref 0.4–1.2)
GFR: 81.65 mL/min (ref >60.00)
Glucose, Bld: 88 mg/dL (ref 70–99)
Potassium: 3.6 mEq/L (ref 3.5–5.1)

## 2011-11-20 MED ORDER — ZOCOR 80 MG PO TABS: 80.0000 mg | ORAL_TABLET | Freq: Every day | ORAL | Status: DC

## 2011-11-20 MED ORDER — MAXALT 10 MG PO TABS: 10.0000 mg | ORAL_TABLET | ORAL | Status: DC | PRN

## 2011-11-20 MED ORDER — RIZATRIPTAN BENZOATE 10 MG PO TABS: 10.0000 mg | ORAL_TABLET | ORAL | Status: DC | PRN

## 2011-11-20 NOTE — Progress Notes (Signed)
Subjective:    Kylie Herring is a 76 y.o. female who presents for Medicare Annual/Subsequent preventive examination.  Preventive Screening-Counseling & Management  Tobacco History  Smoking status  . Never Smoker   Smokeless tobacco  . Not on file     Problems Prior to Visit 1.   Current Problems (verified) Patient Active Problem List  Diagnoses  . LIPOMA NOS  . HYPERLIPIDEMIA  . MIGRAINE HEADACHE  . BRADYCARDIA  . OTHER DISORDER OF COCCYX  . POSTMENOPAUSAL STATUS  . Medicare annual wellness visit, subsequent    Medications Prior to Visit Current Outpatient Prescriptions on File Prior to Visit  Medication Sig Dispense Refill  . aspirin 81 MG EC tablet Take 81 mg by mouth daily.        . calcium elemental as carbonate (TUMS ULTRA 1000) 400 MG tablet Chew 1,000 mg by mouth daily.        . Flaxseed, Linseed, 1000 MG CAPS Take 1 capsule by mouth daily.        . Glucosamine Sulfate 750 MG CAPS Take by mouth.        . loratadine (CLARITIN) 10 MG tablet Take 10 mg by mouth daily as needed.        . Multiple Vitamin (MULTIVITAMIN PO) Take by mouth daily.        . rizatriptan (MAXALT) 10 MG tablet Take 10 mg by mouth as needed.        Marland Kitchen ZOCOR 80 MG tablet Take 1 tablet (80 mg total) by mouth at bedtime.  90 tablet  3  . AMBULATORY NON FORMULARY MEDICATION Wrist Splint with Thumb Spica Left wrist  1 Device  0  . meloxicam (MOBIC) 15 MG tablet Take 1 tablet (15 mg total) by mouth daily.  30 tablet  2    Current Medications (verified) Current Outpatient Prescriptions  Medication Sig Dispense Refill  . aspirin 81 MG EC tablet Take 81 mg by mouth daily.        . calcium elemental as carbonate (TUMS ULTRA 1000) 400 MG tablet Chew 1,000 mg by mouth daily.        . Flaxseed, Linseed, 1000 MG CAPS Take 1 capsule by mouth daily.        . Glucosamine Sulfate 750 MG CAPS Take by mouth.        . loratadine (CLARITIN) 10 MG tablet Take 10 mg by mouth daily as needed.        .  Multiple Vitamin (MULTIVITAMIN PO) Take by mouth daily.        . rizatriptan (MAXALT) 10 MG tablet Take 10 mg by mouth as needed.        Marland Kitchen ZOCOR 80 MG tablet Take 1 tablet (80 mg total) by mouth at bedtime.  90 tablet  3  . AMBULATORY NON FORMULARY MEDICATION Wrist Splint with Thumb Spica Left wrist  1 Device  0  . meloxicam (MOBIC) 15 MG tablet Take 1 tablet (15 mg total) by mouth daily.  30 tablet  2     Allergies (verified) Azelastine hcl   PAST HISTORY  Family History Family History  Problem Relation Age of Onset  . Arthritis    . Diabetes      1 st degree relative  . Hypertension    . Melanoma    . Coronary artery disease      female 1st degree ralative<50  . Heart attack  46    female    Social History History  Substance Use Topics  . Smoking status: Never Smoker   . Smokeless tobacco: Not on file  . Alcohol Use: Yes     Are there smokers in your home (other than you)? No  Risk Factors Current exercise habits: Home exercise routine includes house work.  Dietary issues discussed: na   Cardiac risk factors: advanced age (older than 54 for men, 3 for women), dyslipidemia and sedentary lifestyle.  Depression Screen (Note: if answer to either of the following is "Yes", a more complete depression screening is indicated)   Over the past two weeks, have you felt down, depressed or hopeless? No  Over the past two weeks, have you felt little interest or pleasure in doing things? No  Have you lost interest or pleasure in daily life? No  Do you often feel hopeless? No  Do you cry easily over simple problems? No  Activities of Daily Living In your present state of health, do you have any difficulty performing the following activities?:  Driving? No Managing money?  No Feeding yourself? No Getting from bed to chair? No Climbing a flight of stairs? No Preparing food and eating?: No Bathing or showering? No Getting dressed: No Getting to the toilet? No Using the  toilet:No Moving around from place to place: No In the past year have you fallen or had a near fall?:No   Are you sexually active?  Yes  Do you have more than one partner?  No  Hearing Difficulties: No Do you often ask people to speak up or repeat themselves? No Do you experience ringing or noises in your ears? No Do you have difficulty understanding soft or whispered voices? No   Do you feel that you have a problem with memory? No  Do you often misplace items? No  Do you feel safe at home?  Yes  Cognitive Testing  Alert? Yes  Normal Appearance?Yes  Oriented to person? Yes  Place? Yes   Time? Yes  Recall of three objects?  Yes  Can perform simple calculations? Yes  Displays appropriate judgment?Yes  Can read the correct time from a watch face?Yes   Advanced Directives have been discussed with the patient? Yes  List the Names of Other Physician/Practitioners you currently use: 1.    Indicate any recent Medical Services you may have received from other than [facility] providers in the past year (date may be approximate).  Immunization History  Administered Date(s) Administered  . H1N1 08/06/2008  . Influenza Whole 05/25/2008, 06/09/2009  . Pneumococcal Polysaccharide 08/21/2002  . Td 10/31/2005  . Zoster 10/30/2005    Screening Tests Health Maintenance  Topic Date Due  . Pneumococcal Polysaccharide Vaccine Age 32 And Over  05/16/2003  . Influenza Vaccine  05/21/2012  . Tetanus/tdap  11/01/2015  . Colonoscopy  11/22/2020  . Zostavax  Completed    All answers were reviewed with the patient and necessary referrals were made:  Loreen Freud, DO   11/20/2011   History reviewed: allergies, current medications, past family history, past medical history, past social history, past surgical history and problem list  Review of Systems  Review of Systems  Constitutional: Negative for activity change, appetite change and fatigue.  HENT: Negative for hearing loss, congestion, tinnitus  and ear discharge.  dentist q61m Eyes: Negative for visual disturbance (see optho q1y -- vision corrected to 20/20 with glasses).  Respiratory: Negative for cough, chest tightness and shortness of breath.   Cardiovascular: Negative for chest pain, palpitations and leg swelling.  Gastrointestinal:  Negative for abdominal pain, diarrhea, constipation and abdominal distention.  Genitourinary: Negative for urgency, frequency, decreased urine volume and difficulty urinating.  Musculoskeletal: Negative for back pain, arthralgias and gait problem.  Skin: Negative for color change, pallor and rash.  Neurological: Negative for dizziness, light-headedness, numbness and headaches.  Hematological: Negative for adenopathy. Does not bruise/bleed easily.  Psychiatric/Behavioral: Negative for suicidal ideas, confusion, sleep disturbance, self-injury, dysphoric mood, decreased concentration and agitation.  Pt is able to read and write and can do all ADLs No risk for falling No abuse/ violence in home      Objective:     Vision by Snellen chart: opth  Body mass index is 30.50 kg/(m^2). BP 120/74  Pulse 56  Temp(Src) 98.1 F (36.7 C) (Oral)  Ht 5' 6.25" (1.683 m)  Wt 190 lb 6.4 oz (86.365 kg)  BMI 30.50 kg/m2  SpO2 96%  BP 120/74  Pulse 56  Temp(Src) 98.1 F (36.7 C) (Oral)  Ht 5' 6.25" (1.683 m)  Wt 190 lb 6.4 oz (86.365 kg)  BMI 30.50 kg/m2  SpO2 96% General appearance: alert, cooperative, appears stated age and no distress Head: Normocephalic, without obvious abnormality, atraumatic Eyes: conjunctivae/corneas clear. PERRL, EOM's intact. Fundi benign. Ears: normal TM's and external ear canals both ears Nose: Nares normal. Septum midline. Mucosa normal. No drainage or sinus tenderness. Throat: lips, mucosa, and tongue normal; teeth and gums normal Neck: no adenopathy, no carotid bruit, no JVD, supple, symmetrical, trachea midline and thyroid not enlarged, symmetric, no  tenderness/mass/nodules Back: symmetric, no curvature. ROM normal. No CVA tenderness. Lungs: clear to auscultation bilaterally Breasts: gyn Heart: regular rate and rhythm, S1, S2 normal, no murmur, click, rub or gallop Abdomen: soft, non-tender; bowel sounds normal; no masses,  no organomegaly Pelvic: gyn Extremities: extremities normal, atraumatic, no cyanosis or edema Pulses: 2+ and symmetric Skin: Skin color, texture, turgor normal. No rashes or lesions Lymph nodes: Cervical, supraclavicular, and axillary nodes normal. Neurologic: Alert and oriented X 3, normal strength and tone. Normal symmetric reflexes. Normal coordination and gait Psych-- no depression/ anxiety     Assessment:     cpe     Plan:     During the course of the visit the patient was educated and counseled about appropriate screening and preventive services including:    Pneumococcal vaccine   Td vaccine  Screening mammography  Screening Pap smear and pelvic exam   Bone densitometry screening  Colorectal cancer screening  Glaucoma screening  Advanced directives: has an advanced directive - a copy HAS NOT been provided.  Diet review for nutrition referral? Yes ____  Not Indicated __x__   Patient Instructions (the written plan) was given to the patient.  Medicare Attestation I have personally reviewed: The patient's medical and social history Their use of alcohol, tobacco or illicit drugs Their current medications and supplements The patient's functional ability including ADLs,fall risks, home safety risks, cognitive, and hearing and visual impairment Diet and physical activities Evidence for depression or mood disorders  The patient's weight, height, BMI, and visual acuity have been recorded in the chart.  I have made referrals, counseling, and provided education to the patient based on review of the above and I have provided the patient with a written personalized care plan for preventive  services.     Loreen Freud, DO   11/20/2011

## 2011-11-20 NOTE — Patient Instructions (Signed)
Preventive Care for Adults, Female A healthy lifestyle and preventive care can promote health and wellness. Preventive health guidelines for women include the following key practices.  A routine yearly physical is a good way to check with your caregiver about your health and preventive screening. It is a chance to share any concerns and updates on your health, and to receive a thorough exam.   Visit your dentist for a routine exam and preventive care every 6 months. Brush your teeth twice a day and floss once a day. Good oral hygiene prevents tooth decay and gum disease.   The frequency of eye exams is based on your age, health, family medical history, use of contact lenses, and other factors. Follow your caregiver's recommendations for frequency of eye exams.   Eat a healthy diet. Foods like vegetables, fruits, whole grains, low-fat dairy products, and lean protein foods contain the nutrients you need without too many calories. Decrease your intake of foods high in solid fats, added sugars, and salt. Eat the right amount of calories for you.Get information about a proper diet from your caregiver, if necessary.   Regular physical exercise is one of the most important things you can do for your health. Most adults should get at least 150 minutes of moderate-intensity exercise (any activity that increases your heart rate and causes you to sweat) each week. In addition, most adults need muscle-strengthening exercises on 2 or more days a week.   Maintain a healthy weight. The body mass index (BMI) is a screening tool to identify possible weight problems. It provides an estimate of body fat based on height and weight. Your caregiver can help determine your BMI, and can help you achieve or maintain a healthy weight.For adults 20 years and older:   A BMI below 18.5 is considered underweight.   A BMI of 18.5 to 24.9 is normal.   A BMI of 25 to 29.9 is considered overweight.   A BMI of 30 and above is  considered obese.   Maintain normal blood lipids and cholesterol levels by exercising and minimizing your intake of saturated fat. Eat a balanced diet with plenty of fruit and vegetables. Blood tests for lipids and cholesterol should begin at age 20 and be repeated every 5 years. If your lipid or cholesterol levels are high, you are over 50, or you are at high risk for heart disease, you may need your cholesterol levels checked more frequently.Ongoing high lipid and cholesterol levels should be treated with medicines if diet and exercise are not effective.   If you smoke, find out from your caregiver how to quit. If you do not use tobacco, do not start.   If you are pregnant, do not drink alcohol. If you are breastfeeding, be very cautious about drinking alcohol. If you are not pregnant and choose to drink alcohol, do not exceed 1 drink per day. One drink is considered to be 12 ounces (355 mL) of beer, 5 ounces (148 mL) of wine, or 1.5 ounces (44 mL) of liquor.   Avoid use of street drugs. Do not share needles with anyone. Ask for help if you need support or instructions about stopping the use of drugs.   High blood pressure causes heart disease and increases the risk of stroke. Your blood pressure should be checked at least every 1 to 2 years. Ongoing high blood pressure should be treated with medicines if weight loss and exercise are not effective.   If you are 55 to 74   years old, ask your caregiver if you should take aspirin to prevent strokes.   Diabetes screening involves taking a blood sample to check your fasting blood sugar level. This should be done once every 3 years, after age 45, if you are within normal weight and without risk factors for diabetes. Testing should be considered at a younger age or be carried out more frequently if you are overweight and have at least 1 risk factor for diabetes.   Breast cancer screening is essential preventive care for women. You should practice "breast  self-awareness." This means understanding the normal appearance and feel of your breasts and may include breast self-examination. Any changes detected, no matter how small, should be reported to a caregiver. Women in their 20s and 30s should have a clinical breast exam (CBE) by a caregiver as part of a regular health exam every 1 to 3 years. After age 40, women should have a CBE every year. Starting at age 40, women should consider having a mammography (breast X-ray test) every year. Women who have a family history of breast cancer should talk to their caregiver about genetic screening. Women at a high risk of breast cancer should talk to their caregivers about having magnetic resonance imaging (MRI) and a mammography every year.   The Pap test is a screening test for cervical cancer. A Pap test can show cell changes on the cervix that might become cervical cancer if left untreated. A Pap test is a procedure in which cells are obtained and examined from the lower end of the uterus (cervix).   Women should have a Pap test starting at age 21.   Between ages 21 and 29, Pap tests should be repeated every 2 years.   Beginning at age 30, you should have a Pap test every 3 years as long as the past 3 Pap tests have been normal.   Some women have medical problems that increase the chance of getting cervical cancer. Talk to your caregiver about these problems. It is especially important to talk to your caregiver if a new problem develops soon after your last Pap test. In these cases, your caregiver may recommend more frequent screening and Pap tests.   The above recommendations are the same for women who have or have not gotten the vaccine for human papillomavirus (HPV).   If you had a hysterectomy for a problem that was not cancer or a condition that could lead to cancer, then you no longer need Pap tests. Even if you no longer need a Pap test, a regular exam is a good idea to make sure no other problems are  starting.   If you are between ages 65 and 70, and you have had normal Pap tests going back 10 years, you no longer need Pap tests. Even if you no longer need a Pap test, a regular exam is a good idea to make sure no other problems are starting.   If you have had past treatment for cervical cancer or a condition that could lead to cancer, you need Pap tests and screening for cancer for at least 20 years after your treatment.   If Pap tests have been discontinued, risk factors (such as a new sexual partner) need to be reassessed to determine if screening should be resumed.   The HPV test is an additional test that may be used for cervical cancer screening. The HPV test looks for the virus that can cause the cell changes on the cervix.   The cells collected during the Pap test can be tested for HPV. The HPV test could be used to screen women aged 30 years and older, and should be used in women of any age who have unclear Pap test results. After the age of 30, women should have HPV testing at the same frequency as a Pap test.   Colorectal cancer can be detected and often prevented. Most routine colorectal cancer screening begins at the age of 50 and continues through age 75. However, your caregiver may recommend screening at an earlier age if you have risk factors for colon cancer. On a yearly basis, your caregiver may provide home test kits to check for hidden blood in the stool. Use of a small camera at the end of a tube, to directly examine the colon (sigmoidoscopy or colonoscopy), can detect the earliest forms of colorectal cancer. Talk to your caregiver about this at age 50, when routine screening begins. Direct examination of the colon should be repeated every 5 to 10 years through age 75, unless early forms of pre-cancerous polyps or small growths are found.   Hepatitis C blood testing is recommended for all people born from 1945 through 1965 and any individual with known risks for hepatitis C.    Practice safe sex. Use condoms and avoid high-risk sexual practices to reduce the spread of sexually transmitted infections (STIs). STIs include gonorrhea, chlamydia, syphilis, trichomonas, herpes, HPV, and human immunodeficiency virus (HIV). Herpes, HIV, and HPV are viral illnesses that have no cure. They can result in disability, cancer, and death. Sexually active women aged 25 and younger should be checked for chlamydia. Older women with new or multiple partners should also be tested for chlamydia. Testing for other STIs is recommended if you are sexually active and at increased risk.   Osteoporosis is a disease in which the bones lose minerals and strength with aging. This can result in serious bone fractures. The risk of osteoporosis can be identified using a bone density scan. Women ages 65 and over and women at risk for fractures or osteoporosis should discuss screening with their caregivers. Ask your caregiver whether you should take a calcium supplement or vitamin D to reduce the rate of osteoporosis.   Menopause can be associated with physical symptoms and risks. Hormone replacement therapy is available to decrease symptoms and risks. You should talk to your caregiver about whether hormone replacement therapy is right for you.   Use sunscreen with sun protection factor (SPF) of 30 or more. Apply sunscreen liberally and repeatedly throughout the day. You should seek shade when your shadow is shorter than you. Protect yourself by wearing long sleeves, pants, a wide-brimmed hat, and sunglasses year round, whenever you are outdoors.   Once a month, do a whole body skin exam, using a mirror to look at the skin on your back. Notify your caregiver of new moles, moles that have irregular borders, moles that are larger than a pencil eraser, or moles that have changed in shape or color.   Stay current with required immunizations.   Influenza. You need a dose every fall (or winter). The composition of  the flu vaccine changes each year, so being vaccinated once is not enough.   Pneumococcal polysaccharide. You need 1 to 2 doses if you smoke cigarettes or if you have certain chronic medical conditions. You need 1 dose at age 65 (or older) if you have never been vaccinated.   Tetanus, diphtheria, pertussis (Tdap, Td). Get 1 dose of   Tdap vaccine if you are younger than age 65, are over 65 and have contact with an infant, are a healthcare worker, are pregnant, or simply want to be protected from whooping cough. After that, you need a Td booster dose every 10 years. Consult your caregiver if you have not had at least 3 tetanus and diphtheria-containing shots sometime in your life or have a deep or dirty wound.   HPV. You need this vaccine if you are a woman age 26 or younger. The vaccine is given in 3 doses over 6 months.   Measles, mumps, rubella (MMR). You need at least 1 dose of MMR if you were born in 1957 or later. You may also need a second dose.   Meningococcal. If you are age 19 to 21 and a first-year college student living in a residence hall, or have one of several medical conditions, you need to get vaccinated against meningococcal disease. You may also need additional booster doses.   Zoster (shingles). If you are age 60 or older, you should get this vaccine.   Varicella (chickenpox). If you have never had chickenpox or you were vaccinated but received only 1 dose, talk to your caregiver to find out if you need this vaccine.   Hepatitis A. You need this vaccine if you have a specific risk factor for hepatitis A virus infection or you simply wish to be protected from this disease. The vaccine is usually given as 2 doses, 6 to 18 months apart.   Hepatitis B. You need this vaccine if you have a specific risk factor for hepatitis B virus infection or you simply wish to be protected from this disease. The vaccine is given in 3 doses, usually over 6 months.  Preventive Services /  Frequency Ages 19 to 39  Blood pressure check.** / Every 1 to 2 years.   Lipid and cholesterol check.** / Every 5 years beginning at age 20.   Clinical breast exam.** / Every 3 years for women in their 20s and 30s.   Pap test.** / Every 2 years from ages 21 through 29. Every 3 years starting at age 30 through age 65 or 70 with a history of 3 consecutive normal Pap tests.   HPV screening.** / Every 3 years from ages 30 through ages 65 to 70 with a history of 3 consecutive normal Pap tests.   Hepatitis C blood test.** / For any individual with known risks for hepatitis C.   Skin self-exam. / Monthly.   Influenza immunization.** / Every year.   Pneumococcal polysaccharide immunization.** / 1 to 2 doses if you smoke cigarettes or if you have certain chronic medical conditions.   Tetanus, diphtheria, pertussis (Tdap, Td) immunization. / A one-time dose of Tdap vaccine. After that, you need a Td booster dose every 10 years.   HPV immunization. / 3 doses over 6 months, if you are 26 and younger.   Measles, mumps, rubella (MMR) immunization. / You need at least 1 dose of MMR if you were born in 1957 or later. You may also need a second dose.   Meningococcal immunization. / 1 dose if you are age 19 to 21 and a first-year college student living in a residence hall, or have one of several medical conditions, you need to get vaccinated against meningococcal disease. You may also need additional booster doses.   Varicella immunization.** / Consult your caregiver.   Hepatitis A immunization.** / Consult your caregiver. 2 doses, 6 to 18 months   apart.   Hepatitis B immunization.** / Consult your caregiver. 3 doses usually over 6 months.  Ages 40 to 64  Blood pressure check.** / Every 1 to 2 years.   Lipid and cholesterol check.** / Every 5 years beginning at age 20.   Clinical breast exam.** / Every year after age 40.   Mammogram.** / Every year beginning at age 40 and continuing for as  long as you are in good health. Consult with your caregiver.   Pap test.** / Every 3 years starting at age 30 through age 65 or 70 with a history of 3 consecutive normal Pap tests.   HPV screening.** / Every 3 years from ages 30 through ages 65 to 70 with a history of 3 consecutive normal Pap tests.   Fecal occult blood test (FOBT) of stool. / Every year beginning at age 50 and continuing until age 75. You may not need to do this test if you get a colonoscopy every 10 years.   Flexible sigmoidoscopy or colonoscopy.** / Every 5 years for a flexible sigmoidoscopy or every 10 years for a colonoscopy beginning at age 50 and continuing until age 75.   Hepatitis C blood test.** / For all people born from 1945 through 1965 and any individual with known risks for hepatitis C.   Skin self-exam. / Monthly.   Influenza immunization.** / Every year.   Pneumococcal polysaccharide immunization.** / 1 to 2 doses if you smoke cigarettes or if you have certain chronic medical conditions.   Tetanus, diphtheria, pertussis (Tdap, Td) immunization.** / A one-time dose of Tdap vaccine. After that, you need a Td booster dose every 10 years.   Measles, mumps, rubella (MMR) immunization. / You need at least 1 dose of MMR if you were born in 1957 or later. You may also need a second dose.   Varicella immunization.** / Consult your caregiver.   Meningococcal immunization.** / Consult your caregiver.   Hepatitis A immunization.** / Consult your caregiver. 2 doses, 6 to 18 months apart.   Hepatitis B immunization.** / Consult your caregiver. 3 doses, usually over 6 months.  Ages 65 and over  Blood pressure check.** / Every 1 to 2 years.   Lipid and cholesterol check.** / Every 5 years beginning at age 20.   Clinical breast exam.** / Every year after age 40.   Mammogram.** / Every year beginning at age 40 and continuing for as long as you are in good health. Consult with your caregiver.   Pap test.** /  Every 3 years starting at age 30 through age 65 or 70 with a 3 consecutive normal Pap tests. Testing can be stopped between 65 and 70 with 3 consecutive normal Pap tests and no abnormal Pap or HPV tests in the past 10 years.   HPV screening.** / Every 3 years from ages 30 through ages 65 or 70 with a history of 3 consecutive normal Pap tests. Testing can be stopped between 65 and 70 with 3 consecutive normal Pap tests and no abnormal Pap or HPV tests in the past 10 years.   Fecal occult blood test (FOBT) of stool. / Every year beginning at age 50 and continuing until age 75. You may not need to do this test if you get a colonoscopy every 10 years.   Flexible sigmoidoscopy or colonoscopy.** / Every 5 years for a flexible sigmoidoscopy or every 10 years for a colonoscopy beginning at age 50 and continuing until age 75.   Hepatitis   C blood test.** / For all people born from 1945 through 1965 and any individual with known risks for hepatitis C.   Osteoporosis screening.** / A one-time screening for women ages 65 and over and women at risk for fractures or osteoporosis.   Skin self-exam. / Monthly.   Influenza immunization.** / Every year.   Pneumococcal polysaccharide immunization.** / 1 dose at age 65 (or older) if you have never been vaccinated.   Tetanus, diphtheria, pertussis (Tdap, Td) immunization. / A one-time dose of Tdap vaccine if you are over 65 and have contact with an infant, are a healthcare worker, or simply want to be protected from whooping cough. After that, you need a Td booster dose every 10 years.   Varicella immunization.** / Consult your caregiver.   Meningococcal immunization.** / Consult your caregiver.   Hepatitis A immunization.** / Consult your caregiver. 2 doses, 6 to 18 months apart.   Hepatitis B immunization.** / Check with your caregiver. 3 doses, usually over 6 months.  ** Family history and personal history of risk and conditions may change your caregiver's  recommendations. Document Released: 10/03/2001 Document Revised: 07/27/2011 Document Reviewed: 01/02/2011 [employer] Patient Information 2012 [employer]. 

## 2011-11-20 NOTE — Assessment & Plan Note (Signed)
con't meds  Check labs 

## 2011-11-20 NOTE — Assessment & Plan Note (Signed)
Stable Refill maxalt

## 2012-04-05 ENCOUNTER — Other Ambulatory Visit: Payer: Self-pay | Admitting: Obstetrics and Gynecology

## 2012-04-05 DIAGNOSIS — Z1231 Encounter for screening mammogram for malignant neoplasm of breast: Secondary | ICD-10-CM

## 2012-04-09 ENCOUNTER — Ambulatory Visit (INDEPENDENT_AMBULATORY_CARE_PROVIDER_SITE_OTHER): Payer: Medicare Other | Admitting: Family Medicine

## 2012-04-09 ENCOUNTER — Encounter: Payer: Self-pay | Admitting: Family Medicine

## 2012-04-09 VITALS — BP 116/70 | HR 61 | Temp 97.8°F | Wt 189.2 lb

## 2012-04-09 DIAGNOSIS — J069 Acute upper respiratory infection, unspecified: Secondary | ICD-10-CM

## 2012-04-09 NOTE — Patient Instructions (Signed)
Allergies, Generic Allergies may happen from anything your body is sensitive to. This may be food, medicines, pollens, chemicals, and nearly anything around you in everyday life that produces allergens. An allergen is anything that causes an allergy producing substance. Heredity is often a factor in causing these problems. This means you may have some of the same allergies as your parents. Food allergies happen in all age groups. Food allergies are some of the most severe and life threatening. Some common food allergies are cow's milk, seafood, eggs, nuts, wheat, and soybeans. SYMPTOMS   Swelling around the mouth.   An itchy red rash or hives.   Vomiting or diarrhea.   Difficulty breathing.  SEVERE ALLERGIC REACTIONS ARE LIFE-THREATENING. This reaction is called anaphylaxis. It can cause the mouth and throat to swell and cause difficulty with breathing and swallowing. In severe reactions only a trace amount of food (for example, peanut oil in a salad) may cause death within seconds. Seasonal allergies occur in all age groups. These are seasonal because they usually occur during the same season every year. They may be a reaction to molds, grass pollens, or tree pollens. Other causes of problems are house dust mite allergens, pet dander, and mold spores. The symptoms often consist of nasal congestion, a runny itchy nose associated with sneezing, and tearing itchy eyes. There is often an associated itching of the mouth and ears. The problems happen when you come in contact with pollens and other allergens. Allergens are the particles in the air that the body reacts to with an allergic reaction. This causes you to release allergic antibodies. Through a chain of events, these eventually cause you to release histamine into the blood stream. Although it is meant to be protective to the body, it is this release that causes your discomfort. This is why you were given anti-histamines to feel better. If you are  unable to pinpoint the offending allergen, it may be determined by skin or blood testing. Allergies cannot be cured but can be controlled with medicine. Hay fever is a collection of all or some of the seasonal allergy problems. It may often be treated with simple over-the-counter medicine such as diphenhydramine. Take medicine as directed. Do not drink alcohol or drive while taking this medicine. Check with your caregiver or package insert for child dosages. If these medicines are not effective, there are many new medicines your caregiver can prescribe. Stronger medicine such as nasal spray, eye drops, and corticosteroids may be used if the first things you try do not work well. Other treatments such as immunotherapy or desensitizing injections can be used if all else fails. Follow up with your caregiver if problems continue. These seasonal allergies are usually not life threatening. They are generally more of a nuisance that can often be handled using medicine. HOME CARE INSTRUCTIONS   If unsure what causes a reaction, keep a diary of foods eaten and symptoms that follow. Avoid foods that cause reactions.   If hives or rash are present:   Take medicine as directed.   You may use an over-the-counter antihistamine (diphenhydramine) for hives and itching as needed.   Apply cold compresses (cloths) to the skin or take baths in cool water. Avoid hot baths or showers. Heat will make a rash and itching worse.   If you are severely allergic:   Following a treatment for a severe reaction, hospitalization is often required for closer follow-up.   Wear a medic-alert bracelet or necklace stating the allergy.     You and your family must learn how to give adrenaline or use an anaphylaxis kit.   If you have had a severe reaction, always carry your anaphylaxis kit or EpiPen with you. Use this medicine as directed by your caregiver if a severe reaction is occurring. Failure to do so could have a fatal  outcome.  SEEK MEDICAL CARE IF:  You suspect a food allergy. Symptoms generally happen within 30 minutes of eating a food.   Your symptoms have not gone away within 2 days or are getting worse.   You develop new symptoms.   You want to retest yourself or your child with a food or drink you think causes an allergic reaction. Never do this if an anaphylactic reaction to that food or drink has happened before. Only do this under the care of a caregiver.  SEEK IMMEDIATE MEDICAL CARE IF:   You have difficulty breathing, are wheezing, or have a tight feeling in your chest or throat.   You have a swollen mouth, or you have hives, swelling, or itching all over your body.   You have had a severe reaction that has responded to your anaphylaxis kit or an EpiPen. These reactions may return when the medicine has worn off. These reactions should be considered life threatening.  MAKE SURE YOU:   Understand these instructions.   Will watch your condition.   Will get help right away if you are not doing well or get worse.  Document Released: 10/31/2002 Document Revised: 07/27/2011 Document Reviewed: 04/06/2008 [employer] Patient Information 2012 [employer]. 

## 2012-04-09 NOTE — Progress Notes (Signed)
  Subjective:     Kylie Herring is a 76 y.o. female who presents for evaluation and treatment of allergic symptoms. Symptoms include: clear rhinorrhea, postnasal drip and sinus pressure and are present in a seasonal pattern. Precipitants include: ?Marland Kitchen Treatment currently includes nasal saline and is effective. The following portions of the patient's history were reviewed and updated as appropriate: allergies, current medications, past family history, past medical history, past social history, past surgical history and problem list.  Review of Systems Pertinent items are noted in HPI.    Objective:    BP 116/70  Pulse 61  Temp 97.8 F (36.6 C) (Oral)  Wt 189 lb 3.2 oz (85.821 kg)  SpO2 97% General appearance: alert, cooperative, appears stated age and no distress Ears: normal TM's and external ear canals both ears Nose: clear discharge, mild congestion, sinus tenderness bilateral Throat: abnormal findings: mild oropharyngeal erythema and pnd Neck: no adenopathy, supple, symmetrical, trachea midline and thyroid not enlarged, symmetric, no tenderness/mass/nodules Lungs: clear to auscultation bilaterally Heart: S1, S2 normal    Assessment:    Allergic rhinitis.    Plan:    Medications: intranasal steroids: nasonex, oral antihistamines: claritin. Allergen avoidance discussed. Follow-up in 2 weeks. -- or sooner prn

## 2012-04-22 ENCOUNTER — Encounter: Payer: Self-pay | Admitting: Family Medicine

## 2012-04-23 ENCOUNTER — Other Ambulatory Visit: Payer: Self-pay | Admitting: Family Medicine

## 2012-04-23 ENCOUNTER — Encounter: Payer: Self-pay | Admitting: Family Medicine

## 2012-04-23 DIAGNOSIS — J302 Other seasonal allergic rhinitis: Secondary | ICD-10-CM

## 2012-04-23 MED ORDER — MOMETASONE FUROATE 50 MCG/ACT NA SUSP: NASAL | Status: DC

## 2012-05-06 ENCOUNTER — Encounter: Payer: Self-pay | Admitting: Family Medicine

## 2012-05-06 MED ORDER — CEFUROXIME AXETIL 500 MG PO TABS: 500.0000 mg | ORAL_TABLET | Freq: Two times a day (BID) | ORAL | Status: DC

## 2012-05-17 ENCOUNTER — Encounter: Payer: Self-pay | Admitting: Family Medicine

## 2012-05-22 ENCOUNTER — Ambulatory Visit (INDEPENDENT_AMBULATORY_CARE_PROVIDER_SITE_OTHER): Payer: Medicare Other | Admitting: Family Medicine

## 2012-05-22 ENCOUNTER — Encounter: Payer: Self-pay | Admitting: Family Medicine

## 2012-05-22 VITALS — BP 114/68 | HR 57 | Temp 98.2°F | Wt 190.0 lb

## 2012-05-22 DIAGNOSIS — J329 Chronic sinusitis, unspecified: Secondary | ICD-10-CM

## 2012-05-22 DIAGNOSIS — E785 Hyperlipidemia, unspecified: Secondary | ICD-10-CM

## 2012-05-22 LAB — LIPID PANEL
Cholesterol: 167 mg/dL (ref 0–200)
Triglycerides: 120 mg/dL (ref 0.0–149.0)
VLDL: 24 mg/dL (ref 0.0–40.0)

## 2012-05-22 LAB — HEPATIC FUNCTION PANEL
AST: 27 U/L (ref 0–37)
Albumin: 4.2 g/dL (ref 3.5–5.2)

## 2012-05-22 LAB — BASIC METABOLIC PANEL
BUN: 16 mg/dL (ref 6–23)
Calcium: 10.2 mg/dL (ref 8.4–10.5)
Creatinine, Ser: 0.7 mg/dL (ref 0.4–1.2)
GFR: 81.54 mL/min (ref >60.00)
Glucose, Bld: 79 mg/dL (ref 70–99)
Potassium: 4.1 mEq/L (ref 3.5–5.1)

## 2012-05-22 MED ORDER — MOXIFLOXACIN HCL 400 MG PO TABS: ORAL_TABLET | ORAL | Status: DC

## 2012-05-22 NOTE — Progress Notes (Signed)
  Subjective:     Kylie Herring is a 76 y.o. female who presents for evaluation of sinus pain. Symptoms include: congestion, cough, facial pain, nasal congestion and sinus pressure. Onset of symptoms was 1 month ago. Symptoms have been gradually worsening since that time. Past history is significant for no history of pneumonia or bronchitis. Patient is a non-smoker.  The following portions of the patient's history were reviewed and updated as appropriate: allergies, current medications, past family history, past medical history, past social history, past surgical history and problem list.  Review of Systems Pertinent items are noted in HPI.   Objective:    BP 114/68  Pulse 57  Temp 98.2 F (36.8 C) (Oral)  Wt 190 lb (86.183 kg)  SpO2 98% General appearance: alert, cooperative, appears stated age and no distress Ears: normal TM's and external ear canals both ears Nose: green discharge, moderate congestion, turbinates red, swollen, sinus tenderness bilateral Throat: abnormal findings: mild oropharyngeal erythema and pnd Neck: mild anterior cervical adenopathy, supple, symmetrical, trachea midline and thyroid not enlarged, symmetric, no tenderness/mass/nodules Lungs: clear to auscultation bilaterally Heart: S1, S2 normal    Assessment:    Acute bacterial sinusitis.    Plan:    Nasal steroids per medication orders. Antihistamines per medication orders. avelox per medication orders. rto prn

## 2012-05-22 NOTE — Patient Instructions (Signed)

## 2012-05-22 NOTE — Addendum Note (Signed)
Addended by: Lelon Perla on: 05/22/2012 11:07 AM   Modules accepted: Orders

## 2012-05-27 ENCOUNTER — Encounter: Payer: Self-pay | Admitting: Family Medicine

## 2012-05-30 ENCOUNTER — Other Ambulatory Visit: Payer: Self-pay | Admitting: Family Medicine

## 2012-05-30 DIAGNOSIS — J349 Unspecified disorder of nose and nasal sinuses: Secondary | ICD-10-CM

## 2012-05-31 ENCOUNTER — Ambulatory Visit
Admission: RE | Admit: 2012-05-31 | Discharge: 2012-05-31 | Disposition: A | Discharge status: Home / Self Care | Payer: Medicare Other | Source: Ambulatory Visit | Attending: Obstetrics and Gynecology | Admitting: Obstetrics and Gynecology

## 2012-05-31 DIAGNOSIS — Z1231 Encounter for screening mammogram for malignant neoplasm of breast: Secondary | ICD-10-CM

## 2012-06-07 ENCOUNTER — Encounter: Payer: Self-pay | Admitting: Family Medicine

## 2012-06-07 ENCOUNTER — Other Ambulatory Visit: Payer: Medicare Other

## 2012-06-07 ENCOUNTER — Other Ambulatory Visit: Payer: Self-pay | Admitting: Otolaryngology

## 2012-06-07 DIAGNOSIS — R439 Unspecified disturbances of smell and taste: Secondary | ICD-10-CM

## 2012-06-17 ENCOUNTER — Ambulatory Visit
Admission: RE | Admit: 2012-06-17 | Discharge: 2012-06-17 | Disposition: A | Discharge status: Home / Self Care | Payer: Medicare Other | Source: Ambulatory Visit | Attending: Otolaryngology | Admitting: Otolaryngology

## 2012-06-17 DIAGNOSIS — R439 Unspecified disturbances of smell and taste: Secondary | ICD-10-CM

## 2012-06-17 MED ORDER — GADOBENATE DIMEGLUMINE 529 MG/ML IV SOLN
17.0000 mL | Freq: Once | INTRAVENOUS | Status: AC | PRN
  Administered 2012-06-17: 17 mL via INTRAVENOUS

## 2012-07-01 ENCOUNTER — Telehealth: Payer: Self-pay | Admitting: Family Medicine

## 2012-07-01 ENCOUNTER — Ambulatory Visit: Payer: Medicare Other | Admitting: Family Medicine

## 2012-07-01 NOTE — Telephone Encounter (Signed)
Cant she be seen here today?

## 2012-07-01 NOTE — Telephone Encounter (Signed)
[facility] Triage Call Report Triage Record Num: [id number] Operator: Audelia Hives Patient Name: Kylie Herring Call Date & Time: 06/30/2012 9:08:51AM Patient Phone: [phone] PCP: Lelon Perla Patient Gender: Female PCP Fax : [phone] Patient DOB: 1936-03-03 Practice Name: [facility] Reason for Call: Caller: Callen/Patient; PCP: Lelon Perla.; CB#: [phone]; Call regarding Urinary Pain/Bleeding; Calling regarding urgency, frequency of urination, some blood in the urine, painful urination that started 06/29/12, afebrile. Emergent signs and symptoms ruled out as per Bloody Urine protocol except for see in 24 hours due to has one or more urinary tract symptoms and has not been evaluated. Advised to proceed to UC and compliant. Protocol(s) Used: Bloody Urine Protocol(s) Used: Urinary Symptoms - Female Recommended Outcome per Protocol: See Provider within 24 hours Reason for Outcome: Blood in urine Has one or more urinary tract symptoms AND has not been previously evaluated Care Advice: ~ Go to the ED IMMEDIATELY if repeated vomiting or severe pain occurs. ~ Call provider if you develop flank or low back pain, fever, chills, or generally feel sick. ~ SYMPTOM / CONDITION MANAGEMENT Limit carbonated, alcoholic, and caffeinated beverages such as coffee, tea and soda. Avoid nonprescription cold and allergy medications that contain caffeine. Limit intake of tomatoes, fruit juices (except for unsweetened cranberry juice), dairy products, spicy foods, sugar, and artificial sweeteners (aspartame or saccharine). Stop or decrease smoking. Reducing exposure to bladder irritants may help lessen urgency. ~ Increase intake of fluids to 8-16 eight oz. glasses (2,000 to 4,000 cc per day or 1.6 to 3.2 L) so urine is a pale yellow color, to help flush bacteria from the bladder, unless instructed to restrict fluid intake for other medical reasons. Limit fluids  with sugar. ~

## 2012-11-20 ENCOUNTER — Ambulatory Visit (INDEPENDENT_AMBULATORY_CARE_PROVIDER_SITE_OTHER): Payer: Medicare Other | Admitting: Family Medicine

## 2012-11-20 ENCOUNTER — Encounter: Payer: Self-pay | Admitting: Family Medicine

## 2012-11-20 VITALS — BP 114/76 | HR 60 | Temp 98.0°F | Ht 66.5 in | Wt 190.4 lb

## 2012-11-20 DIAGNOSIS — Z0000 Encounter for general adult medical examination without abnormal findings: Secondary | ICD-10-CM

## 2012-11-20 DIAGNOSIS — E785 Hyperlipidemia, unspecified: Secondary | ICD-10-CM

## 2012-11-20 LAB — BASIC METABOLIC PANEL
BUN: 15 mg/dL (ref 6–23)
Creatinine, Ser: 0.8 mg/dL (ref 0.4–1.2)
GFR: 76.63 mL/min (ref >60.00)
Glucose, Bld: 92 mg/dL (ref 70–99)
Potassium: 3.8 mEq/L (ref 3.5–5.1)

## 2012-11-20 LAB — LIPID PANEL
Cholesterol: 150 mg/dL (ref 0–200)
HDL: 66.4 mg/dL (ref >39.00)
LDL Cholesterol: 65 mg/dL (ref 0–99)
Triglycerides: 92 mg/dL (ref 0.0–149.0)
VLDL: 18.4 mg/dL (ref 0.0–40.0)

## 2012-11-20 LAB — CBC WITH DIFFERENTIAL/PLATELET
Basophils Absolute: 0 10*3/uL (ref 0.0–0.1)
HCT: 43.9 % (ref 36.0–46.0)
Lymphs Abs: 1.4 10*3/uL (ref 0.7–4.0)
MCV: 93.2 fl (ref 78.0–100.0)
Monocytes Absolute: 0.3 10*3/uL (ref 0.1–1.0)
Platelets: 216 10*3/uL (ref 150.0–400.0)
RDW: 13.7 % (ref 11.5–14.6)

## 2012-11-20 LAB — HEPATIC FUNCTION PANEL: Total Bilirubin: 0.9 mg/dL (ref 0.3–1.2)

## 2012-11-20 MED ORDER — ZOCOR 80 MG PO TABS: 80.0000 mg | ORAL_TABLET | Freq: Every day | ORAL | Status: DC

## 2012-11-20 NOTE — Assessment & Plan Note (Signed)
Check labs con't meds 

## 2012-11-20 NOTE — Patient Instructions (Addendum)
Preventive Care for Adults, Female A healthy lifestyle and preventive care can promote health and wellness. Preventive health guidelines for women include the following key practices.  A routine yearly physical is a good way to check with your caregiver about your health and preventive screening. It is a chance to share any concerns and updates on your health, and to receive a thorough exam.  Visit your dentist for a routine exam and preventive care every 6 months. Brush your teeth twice a day and floss once a day. Good oral hygiene prevents tooth decay and gum disease.  The frequency of eye exams is based on your age, health, family medical history, use of contact lenses, and other factors. Follow your caregiver's recommendations for frequency of eye exams.  Eat a healthy diet. Foods like vegetables, fruits, whole grains, low-fat dairy products, and lean protein foods contain the nutrients you need without too many calories. Decrease your intake of foods high in solid fats, added sugars, and salt. Eat the right amount of calories for you.Get information about a proper diet from your caregiver, if necessary.  Regular physical exercise is one of the most important things you can do for your health. Most adults should get at least 150 minutes of moderate-intensity exercise (any activity that increases your heart rate and causes you to sweat) each week. In addition, most adults need muscle-strengthening exercises on 2 or more days a week.  Maintain a healthy weight. The body mass index (BMI) is a screening tool to identify possible weight problems. It provides an estimate of body fat based on height and weight. Your caregiver can help determine your BMI, and can help you achieve or maintain a healthy weight.For adults 20 years and older:  A BMI below 18.5 is considered underweight.  A BMI of 18.5 to 24.9 is normal.  A BMI of 25 to 29.9 is considered overweight.  A BMI of 30 and above is  considered obese.  Maintain normal blood lipids and cholesterol levels by exercising and minimizing your intake of saturated fat. Eat a balanced diet with plenty of fruit and vegetables. Blood tests for lipids and cholesterol should begin at age 20 and be repeated every 5 years. If your lipid or cholesterol levels are high, you are over 50, or you are at high risk for heart disease, you may need your cholesterol levels checked more frequently.Ongoing high lipid and cholesterol levels should be treated with medicines if diet and exercise are not effective.  If you smoke, find out from your caregiver how to quit. If you do not use tobacco, do not start.  If you are pregnant, do not drink alcohol. If you are breastfeeding, be very cautious about drinking alcohol. If you are not pregnant and choose to drink alcohol, do not exceed 1 drink per day. One drink is considered to be 12 ounces (355 mL) of beer, 5 ounces (148 mL) of wine, or 1.5 ounces (44 mL) of liquor.  Avoid use of street drugs. Do not share needles with anyone. Ask for help if you need support or instructions about stopping the use of drugs.  High blood pressure causes heart disease and increases the risk of stroke. Your blood pressure should be checked at least every 1 to 2 years. Ongoing high blood pressure should be treated with medicines if weight loss and exercise are not effective.  If you are 55 to 75 years old, ask your caregiver if you should take aspirin to prevent strokes.  Diabetes   screening involves taking a blood sample to check your fasting blood sugar level. This should be done once every 3 years, after age 45, if you are within normal weight and without risk factors for diabetes. Testing should be considered at a younger age or be carried out more frequently if you are overweight and have at least 1 risk factor for diabetes.  Breast cancer screening is essential preventive care for women. You should practice "breast  self-awareness." This means understanding the normal appearance and feel of your breasts and may include breast self-examination. Any changes detected, no matter how small, should be reported to a caregiver. Women in their 20s and 30s should have a clinical breast exam (CBE) by a caregiver as part of a regular health exam every 1 to 3 years. After age 40, women should have a CBE every year. Starting at age 40, women should consider having a mammography (breast X-ray test) every year. Women who have a family history of breast cancer should talk to their caregiver about genetic screening. Women at a high risk of breast cancer should talk to their caregivers about having magnetic resonance imaging (MRI) and a mammography every year.  The Pap test is a screening test for cervical cancer. A Pap test can show cell changes on the cervix that might become cervical cancer if left untreated. A Pap test is a procedure in which cells are obtained and examined from the lower end of the uterus (cervix).  Women should have a Pap test starting at age 21.  Between ages 21 and 29, Pap tests should be repeated every 2 years.  Beginning at age 30, you should have a Pap test every 3 years as long as the past 3 Pap tests have been normal.  Some women have medical problems that increase the chance of getting cervical cancer. Talk to your caregiver about these problems. It is especially important to talk to your caregiver if a new problem develops soon after your last Pap test. In these cases, your caregiver may recommend more frequent screening and Pap tests.  The above recommendations are the same for women who have or have not gotten the vaccine for human papillomavirus (HPV).  If you had a hysterectomy for a problem that was not cancer or a condition that could lead to cancer, then you no longer need Pap tests. Even if you no longer need a Pap test, a regular exam is a good idea to make sure no other problems are  starting.  If you are between ages 65 and 70, and you have had normal Pap tests going back 10 years, you no longer need Pap tests. Even if you no longer need a Pap test, a regular exam is a good idea to make sure no other problems are starting.  If you have had past treatment for cervical cancer or a condition that could lead to cancer, you need Pap tests and screening for cancer for at least 20 years after your treatment.  If Pap tests have been discontinued, risk factors (such as a new sexual partner) need to be reassessed to determine if screening should be resumed.  The HPV test is an additional test that may be used for cervical cancer screening. The HPV test looks for the virus that can cause the cell changes on the cervix. The cells collected during the Pap test can be tested for HPV. The HPV test could be used to screen women aged 30 years and older, and should   be used in women of any age who have unclear Pap test results. After the age of 30, women should have HPV testing at the same frequency as a Pap test.  Colorectal cancer can be detected and often prevented. Most routine colorectal cancer screening begins at the age of 50 and continues through age 75. However, your caregiver may recommend screening at an earlier age if you have risk factors for colon cancer. On a yearly basis, your caregiver may provide home test kits to check for hidden blood in the stool. Use of a small camera at the end of a tube, to directly examine the colon (sigmoidoscopy or colonoscopy), can detect the earliest forms of colorectal cancer. Talk to your caregiver about this at age 50, when routine screening begins. Direct examination of the colon should be repeated every 5 to 10 years through age 75, unless early forms of pre-cancerous polyps or small growths are found.  Hepatitis C blood testing is recommended for all people born from 1945 through 1965 and any individual with known risks for hepatitis C.  Practice  safe sex. Use condoms and avoid high-risk sexual practices to reduce the spread of sexually transmitted infections (STIs). STIs include gonorrhea, chlamydia, syphilis, trichomonas, herpes, HPV, and human immunodeficiency virus (HIV). Herpes, HIV, and HPV are viral illnesses that have no cure. They can result in disability, cancer, and death. Sexually active women aged 25 and younger should be checked for chlamydia. Older women with new or multiple partners should also be tested for chlamydia. Testing for other STIs is recommended if you are sexually active and at increased risk.  Osteoporosis is a disease in which the bones lose minerals and strength with aging. This can result in serious bone fractures. The risk of osteoporosis can be identified using a bone density scan. Women ages 65 and over and women at risk for fractures or osteoporosis should discuss screening with their caregivers. Ask your caregiver whether you should take a calcium supplement or vitamin D to reduce the rate of osteoporosis.  Menopause can be associated with physical symptoms and risks. Hormone replacement therapy is available to decrease symptoms and risks. You should talk to your caregiver about whether hormone replacement therapy is right for you.  Use sunscreen with sun protection factor (SPF) of 30 or more. Apply sunscreen liberally and repeatedly throughout the day. You should seek shade when your shadow is shorter than you. Protect yourself by wearing long sleeves, pants, a wide-brimmed hat, and sunglasses year round, whenever you are outdoors.  Once a month, do a whole body skin exam, using a mirror to look at the skin on your back. Notify your caregiver of new moles, moles that have irregular borders, moles that are larger than a pencil eraser, or moles that have changed in shape or color.  Stay current with required immunizations.  Influenza. You need a dose every fall (or winter). The composition of the flu vaccine  changes each year, so being vaccinated once is not enough.  Pneumococcal polysaccharide. You need 1 to 2 doses if you smoke cigarettes or if you have certain chronic medical conditions. You need 1 dose at age 65 (or older) if you have never been vaccinated.  Tetanus, diphtheria, pertussis (Tdap, Td). Get 1 dose of Tdap vaccine if you are younger than age 65, are over 65 and have contact with an infant, are a healthcare worker, are pregnant, or simply want to be protected from whooping cough. After that, you need a Td   booster dose every 10 years. Consult your caregiver if you have not had at least 3 tetanus and diphtheria-containing shots sometime in your life or have a deep or dirty wound.  HPV. You need this vaccine if you are a woman age 26 or younger. The vaccine is given in 3 doses over 6 months.  Measles, mumps, rubella (MMR). You need at least 1 dose of MMR if you were born in 1957 or later. You may also need a second dose.  Meningococcal. If you are age 19 to 21 and a first-year college student living in a residence hall, or have one of several medical conditions, you need to get vaccinated against meningococcal disease. You may also need additional booster doses.  Zoster (shingles). If you are age 60 or older, you should get this vaccine.  Varicella (chickenpox). If you have never had chickenpox or you were vaccinated but received only 1 dose, talk to your caregiver to find out if you need this vaccine.  Hepatitis A. You need this vaccine if you have a specific risk factor for hepatitis A virus infection or you simply wish to be protected from this disease. The vaccine is usually given as 2 doses, 6 to 18 months apart.  Hepatitis B. You need this vaccine if you have a specific risk factor for hepatitis B virus infection or you simply wish to be protected from this disease. The vaccine is given in 3 doses, usually over 6 months. Preventive Services / Frequency Ages 19 to 39  Blood  pressure check.** / Every 1 to 2 years.  Lipid and cholesterol check.** / Every 5 years beginning at age 20.  Clinical breast exam.** / Every 3 years for women in their 20s and 30s.  Pap test.** / Every 2 years from ages 21 through 29. Every 3 years starting at age 30 through age 65 or 70 with a history of 3 consecutive normal Pap tests.  HPV screening.** / Every 3 years from ages 30 through ages 65 to 70 with a history of 3 consecutive normal Pap tests.  Hepatitis C blood test.** / For any individual with known risks for hepatitis C.  Skin self-exam. / Monthly.  Influenza immunization.** / Every year.  Pneumococcal polysaccharide immunization.** / 1 to 2 doses if you smoke cigarettes or if you have certain chronic medical conditions.  Tetanus, diphtheria, pertussis (Tdap, Td) immunization. / A one-time dose of Tdap vaccine. After that, you need a Td booster dose every 10 years.  HPV immunization. / 3 doses over 6 months, if you are 26 and younger.  Measles, mumps, rubella (MMR) immunization. / You need at least 1 dose of MMR if you were born in 1957 or later. You may also need a second dose.  Meningococcal immunization. / 1 dose if you are age 19 to 21 and a first-year college student living in a residence hall, or have one of several medical conditions, you need to get vaccinated against meningococcal disease. You may also need additional booster doses.  Varicella immunization.** / Consult your caregiver.  Hepatitis A immunization.** / Consult your caregiver. 2 doses, 6 to 18 months apart.  Hepatitis B immunization.** / Consult your caregiver. 3 doses usually over 6 months. Ages 40 to 64  Blood pressure check.** / Every 1 to 2 years.  Lipid and cholesterol check.** / Every 5 years beginning at age 20.  Clinical breast exam.** / Every year after age 40.  Mammogram.** / Every year beginning at age 40   and continuing for as long as you are in good health. Consult with your  caregiver.  Pap test.** / Every 3 years starting at age 30 through age 65 or 70 with a history of 3 consecutive normal Pap tests.  HPV screening.** / Every 3 years from ages 30 through ages 65 to 70 with a history of 3 consecutive normal Pap tests.  Fecal occult blood test (FOBT) of stool. / Every year beginning at age 50 and continuing until age 75. You may not need to do this test if you get a colonoscopy every 10 years.  Flexible sigmoidoscopy or colonoscopy.** / Every 5 years for a flexible sigmoidoscopy or every 10 years for a colonoscopy beginning at age 50 and continuing until age 75.  Hepatitis C blood test.** / For all people born from 1945 through 1965 and any individual with known risks for hepatitis C.  Skin self-exam. / Monthly.  Influenza immunization.** / Every year.  Pneumococcal polysaccharide immunization.** / 1 to 2 doses if you smoke cigarettes or if you have certain chronic medical conditions.  Tetanus, diphtheria, pertussis (Tdap, Td) immunization.** / A one-time dose of Tdap vaccine. After that, you need a Td booster dose every 10 years.  Measles, mumps, rubella (MMR) immunization. / You need at least 1 dose of MMR if you were born in 1957 or later. You may also need a second dose.  Varicella immunization.** / Consult your caregiver.  Meningococcal immunization.** / Consult your caregiver.  Hepatitis A immunization.** / Consult your caregiver. 2 doses, 6 to 18 months apart.  Hepatitis B immunization.** / Consult your caregiver. 3 doses, usually over 6 months. Ages 65 and over  Blood pressure check.** / Every 1 to 2 years.  Lipid and cholesterol check.** / Every 5 years beginning at age 20.  Clinical breast exam.** / Every year after age 40.  Mammogram.** / Every year beginning at age 40 and continuing for as long as you are in good health. Consult with your caregiver.  Pap test.** / Every 3 years starting at age 30 through age 65 or 70 with a 3  consecutive normal Pap tests. Testing can be stopped between 65 and 70 with 3 consecutive normal Pap tests and no abnormal Pap or HPV tests in the past 10 years.  HPV screening.** / Every 3 years from ages 30 through ages 65 or 70 with a history of 3 consecutive normal Pap tests. Testing can be stopped between 65 and 70 with 3 consecutive normal Pap tests and no abnormal Pap or HPV tests in the past 10 years.  Fecal occult blood test (FOBT) of stool. / Every year beginning at age 50 and continuing until age 75. You may not need to do this test if you get a colonoscopy every 10 years.  Flexible sigmoidoscopy or colonoscopy.** / Every 5 years for a flexible sigmoidoscopy or every 10 years for a colonoscopy beginning at age 50 and continuing until age 75.  Hepatitis C blood test.** / For all people born from 1945 through 1965 and any individual with known risks for hepatitis C.  Osteoporosis screening.** / A one-time screening for women ages 65 and over and women at risk for fractures or osteoporosis.  Skin self-exam. / Monthly.  Influenza immunization.** / Every year.  Pneumococcal polysaccharide immunization.** / 1 dose at age 65 (or older) if you have never been vaccinated.  Tetanus, diphtheria, pertussis (Tdap, Td) immunization. / A one-time dose of Tdap vaccine if you are over   65 and have contact with an infant, are a healthcare worker, or simply want to be protected from whooping cough. After that, you need a Td booster dose every 10 years.  Varicella immunization.** / Consult your caregiver.  Meningococcal immunization.** / Consult your caregiver.  Hepatitis A immunization.** / Consult your caregiver. 2 doses, 6 to 18 months apart.  Hepatitis B immunization.** / Check with your caregiver. 3 doses, usually over 6 months. ** Family history and personal history of risk and conditions may change your caregiver's recommendations. Document Released: 10/03/2001 Document Revised: 10/30/2011  Document Reviewed: 01/02/2011 [employer] Patient Information 2013 [employer].  

## 2012-11-20 NOTE — Progress Notes (Signed)
Subjective:    Kylie Herring is a 77 y.o. female who presents for Medicare Annual/Subsequent preventive examination.  Preventive Screening-Counseling & Management  Tobacco History  Smoking status  . Never Smoker   Smokeless tobacco  . Not on file     Problems Prior to Visit 1. none  Current Problems (verified) Patient Active Problem List  Diagnosis  . LIPOMA NOS  . HYPERLIPIDEMIA  . MIGRAINE HEADACHE  . BRADYCARDIA  . OTHER DISORDER OF COCCYX  . POSTMENOPAUSAL STATUS  . Medicare annual wellness visit, subsequent    Medications Prior to Visit Current Outpatient Prescriptions on File Prior to Visit  Medication Sig Dispense Refill  . aspirin 81 MG EC tablet Take 81 mg by mouth daily.        . calcium elemental as carbonate (TUMS ULTRA 1000) 400 MG tablet Chew 1,000 mg by mouth daily.        . Flaxseed, Linseed, 1000 MG CAPS Take 1 capsule by mouth daily.        . Glucosamine Sulfate 750 MG CAPS Take by mouth.        . loratadine (CLARITIN) 10 MG tablet Take 10 mg by mouth daily as needed.        Marland Kitchen MAXALT 10 MG tablet Take 1 tablet (10 mg total) by mouth as needed for migraine.  30 tablet  0  . Multiple Vitamin (MULTIVITAMIN PO) Take by mouth daily.        Marland Kitchen ZOCOR 80 MG tablet Take 1 tablet (80 mg total) by mouth at bedtime.  90 tablet  3   No current facility-administered medications on file prior to visit.    Current Medications (verified) Current Outpatient Prescriptions  Medication Sig Dispense Refill  . aspirin 81 MG EC tablet Take 81 mg by mouth daily.        . calcium elemental as carbonate (TUMS ULTRA 1000) 400 MG tablet Chew 1,000 mg by mouth daily.        . Flaxseed, Linseed, 1000 MG CAPS Take 1 capsule by mouth daily.        . Glucosamine Sulfate 750 MG CAPS Take by mouth.        . loratadine (CLARITIN) 10 MG tablet Take 10 mg by mouth daily as needed.        Marland Kitchen MAXALT 10 MG tablet Take 1 tablet (10 mg total) by mouth as needed for migraine.  30  tablet  0  . Multiple Vitamin (MULTIVITAMIN PO) Take by mouth daily.        Marland Kitchen ZOCOR 80 MG tablet Take 1 tablet (80 mg total) by mouth at bedtime.  90 tablet  3   No current facility-administered medications for this visit.     Allergies (verified) Azelastine hcl   PAST HISTORY  Family History Family History  Problem Relation Age of Onset  . Arthritis    . Diabetes      1 st degree relative  . Hypertension    . Melanoma    . Coronary artery disease      female 1st degree ralative<50  . Heart attack  8    female    Social History History  Substance Use Topics  . Smoking status: Never Smoker   . Smokeless tobacco: Not on file  . Alcohol Use: Yes     Are there smokers in your home (other than you)? No  Risk Factors Current exercise habits: The patient does not participate in regular exercise at present.  Dietary issues discussed: na   Cardiac risk factors: advanced age (older than 81 for men, 4 for women), dyslipidemia and sedentary lifestyle.  Depression Screen (Note: if answer to either of the following is "Yes", a more complete depression screening is indicated)   Over the past two weeks, have you felt down, depressed or hopeless? No  Over the past two weeks, have you felt little interest or pleasure in doing things? No  Have you lost interest or pleasure in daily life? No  Do you often feel hopeless? No  Do you cry easily over simple problems? No  Activities of Daily Living In your present state of health, do you have any difficulty performing the following activities?:  Driving? No Managing money?  No Feeding yourself? No Getting from bed to chair? No Climbing a flight of stairs? No Preparing food and eating?: No Bathing or showering? No Getting dressed: No Getting to the toilet? No Using the toilet:No Moving around from place to place: No In the past year have you fallen or had a near fall?:No   Are you sexually active?  Yes  Do you have more than  one partner?  No  Hearing Difficulties: No Do you often ask people to speak up or repeat themselves? No Do you experience ringing or noises in your ears? No Do you have difficulty understanding soft or whispered voices? No   Do you feel that you have a problem with memory? No  Do you often misplace items? No  Do you feel safe at home?  Yes  Cognitive Testing  Alert? Yes  Normal Appearance?Yes  Oriented to person? Yes  Place? Yes   Time? Yes  Recall of three objects?  Yes  Can perform simple calculations? Yes  Displays appropriate judgment?Yes  Can read the correct time from a watch face?Yes   Advanced Directives have been discussed with the patient? Yes  List the Names of Other Physician/Practitioners you currently use: 1.  opth--[facility] 2 gyn--[facility] 3 dentist--[facility] Indicate any recent Medical Services you may have received from other than [facility] providers in the past year (date may be approximate).  Immunization History  Administered Date(s) Administered  . H1N1 08/06/2008  . Influenza Whole 05/25/2008, 06/09/2009, 05/31/2012  . Pneumococcal Polysaccharide 06/16/2003  . Td 10/31/2005  . Tdap 11/20/2011  . Zoster 10/30/2005    Screening Tests Health Maintenance  Topic Date Due  . Influenza Vaccine  04/21/2013  . Colonoscopy  11/22/2020  . Tetanus/tdap  11/19/2021  . Pneumococcal Polysaccharide Vaccine Age 28 And Over  Completed  . Zostavax  Completed    All answers were reviewed with the patient and necessary referrals were made:  Loreen Freud, DO   11/20/2012   History reviewed:  She  has a past medical history of Migraines and Hyperlipidemia. She  does not have any pertinent problems on file. She  has past surgical history that includes Blepharoplasty. Her family history includes Arthritis in an unspecified family member; Coronary artery disease in an unspecified family member; Diabetes in an unspecified family member; Heart attack (age of onset: 55) in an  unspecified family member; Hypertension in an unspecified family member; and Melanoma in an unspecified family member. She  reports that she has never smoked. She does not have any smokeless tobacco history on file. She reports that  drinks alcohol. She reports that she does not use illicit drugs. She has a current medication list which includes the following prescription(s): aspirin, calcium elemental as carbonate, flaxseed (linseed),  glucosamine sulfate, loratadine, maxalt, multiple vitamins-minerals, and zocor. Current Outpatient Prescriptions on File Prior to Visit  Medication Sig Dispense Refill  . aspirin 81 MG EC tablet Take 81 mg by mouth daily.        . calcium elemental as carbonate (TUMS ULTRA 1000) 400 MG tablet Chew 1,000 mg by mouth daily.        . Flaxseed, Linseed, 1000 MG CAPS Take 1 capsule by mouth daily.        . Glucosamine Sulfate 750 MG CAPS Take by mouth.        . loratadine (CLARITIN) 10 MG tablet Take 10 mg by mouth daily as needed.        Marland Kitchen MAXALT 10 MG tablet Take 1 tablet (10 mg total) by mouth as needed for migraine.  30 tablet  0  . Multiple Vitamin (MULTIVITAMIN PO) Take by mouth daily.        Marland Kitchen ZOCOR 80 MG tablet Take 1 tablet (80 mg total) by mouth at bedtime.  90 tablet  3   No current facility-administered medications on file prior to visit.   She is allergic to azelastine hcl.  Review of Systems  Review of Systems  Constitutional: Negative for activity change, appetite change and fatigue.  HENT: Negative for hearing loss, congestion, tinnitus and ear discharge.   Eyes: Negative for visual disturbance (see optho q1y -- vision corrected to 20/20 with glasses).  Respiratory: Negative for cough, chest tightness and shortness of breath.   Cardiovascular: Negative for chest pain, palpitations and leg swelling.  Gastrointestinal: Negative for abdominal pain, diarrhea, constipation and abdominal distention.  Genitourinary: Negative for urgency, frequency,  decreased urine volume and difficulty urinating.  Musculoskeletal: Negative for back pain, arthralgias and gait problem.  Skin: Negative for color change, pallor and rash.  Neurological: Negative for dizziness, light-headedness, numbness and headaches.  Hematological: Negative for adenopathy. Does not bruise/bleed easily.  Psychiatric/Behavioral: Negative for suicidal ideas, confusion, sleep disturbance, self-injury, dysphoric mood, decreased concentration and agitation.  Pt is able to read and write and can do all ADLs No risk for falling No abuse/ violence in home    Objective:     Vision by Snellen chart: opth Body mass index is 30.27 kg/(m^2). BP 114/76  Pulse 60  Temp(Src) 98 F (36.7 C) (Oral)  Ht 5' 6.5" (1.689 m)  Wt 190 lb 6.4 oz (86.365 kg)  BMI 30.27 kg/m2  SpO2 95%  BP 114/76  Pulse 60  Temp(Src) 98 F (36.7 C) (Oral)  Ht 5' 6.5" (1.689 m)  Wt 190 lb 6.4 oz (86.365 kg)  BMI 30.27 kg/m2  SpO2 95% General appearance: alert, cooperative, appears stated age and no distress Head: Normocephalic, without obvious abnormality, atraumatic Eyes: conjunctivae/corneas clear. PERRL, EOM's intact. Fundi benign. Ears: normal TM's and external ear canals both ears Nose: Nares normal. Septum midline. Mucosa normal. No drainage or sinus tenderness. Throat: lips, mucosa, and tongue normal; teeth and gums normal Neck: no adenopathy, no carotid bruit, no JVD, supple, symmetrical, trachea midline and thyroid not enlarged, symmetric, no tenderness/mass/nodules Back: symmetric, no curvature. ROM normal. No CVA tenderness. Lungs: clear to auscultation bilaterally Breasts: gyn Heart: regular rate and rhythm, S1, S2 normal, no murmur, click, rub or gallop Abdomen: soft, non-tender; bowel sounds normal; no masses,  no organomegaly Pelvic: deferred--gyn Extremities: extremities normal, atraumatic, no cyanosis or edema Pulses: 2+ and symmetric Skin: Skin color, texture, turgor normal. No  rashes or lesions Lymph nodes: Cervical, supraclavicular, and axillary nodes normal.  Neurologic: Alert and oriented X 3, normal strength and tone. Normal symmetric reflexes. Normal coordination and gait Psych---no depression, no anxiety      Assessment:     cpe      Plan:     During the course of the visit the patient was educated and counseled about appropriate screening and preventive services including:    Pneumococcal vaccine   Influenza vaccine  Screening electrocardiogram  Screening mammography  Screening Pap smear and pelvic exam   Bone densitometry screening  Colorectal cancer screening  Diabetes screening  Glaucoma screening  Smoking cessation counseling  Advanced directives: has an advanced directive - a copy HAS NOT been provided.  Diet review for nutrition referral? Yes ____  Not Indicated __x__   Patient Instructions (the written plan) was given to the patient.  Medicare Attestation I have personally reviewed: The patient's medical and social history Their use of alcohol, tobacco or illicit drugs Their current medications and supplements The patient's functional ability including ADLs,fall risks, home safety risks, cognitive, and hearing and visual impairment Diet and physical activities Evidence for depression or mood disorders  The patient's weight, height, BMI, and visual acuity have been recorded in the chart.  I have made referrals, counseling, and provided education to the patient based on review of the above and I have provided the patient with a written personalized care plan for preventive services.     Loreen Freud, DO   11/20/2012

## 2012-11-26 LAB — POCT URINALYSIS DIPSTICK
Bilirubin, UA: NEGATIVE
Ketones, UA: NEGATIVE
Protein, UA: NEGATIVE
Spec Grav, UA: <=1.005
pH, UA: 7.5

## 2013-04-23 ENCOUNTER — Other Ambulatory Visit: Payer: Self-pay

## 2013-04-23 DIAGNOSIS — Z1231 Encounter for screening mammogram for malignant neoplasm of breast: Secondary | ICD-10-CM

## 2013-06-04 ENCOUNTER — Ambulatory Visit
Admission: RE | Admit: 2013-06-04 | Discharge: 2013-06-04 | Disposition: A | Discharge status: Home / Self Care | Payer: Medicare Other | Source: Ambulatory Visit

## 2013-06-04 DIAGNOSIS — Z1231 Encounter for screening mammogram for malignant neoplasm of breast: Secondary | ICD-10-CM

## 2013-06-06 ENCOUNTER — Telehealth: Payer: Self-pay | Admitting: Family Medicine

## 2013-06-06 DIAGNOSIS — E785 Hyperlipidemia, unspecified: Secondary | ICD-10-CM

## 2013-06-06 NOTE — Telephone Encounter (Signed)
Patient called and stated that it was time to get her chl checked. patient  schedule a cholesterol check for the lab on 06/11/2013. Please advise me with lab orders thanks

## 2013-06-10 ENCOUNTER — Other Ambulatory Visit (INDEPENDENT_AMBULATORY_CARE_PROVIDER_SITE_OTHER): Payer: Medicare Other

## 2013-06-10 ENCOUNTER — Other Ambulatory Visit: Payer: Self-pay | Admitting: Family Medicine

## 2013-06-10 DIAGNOSIS — E785 Hyperlipidemia, unspecified: Secondary | ICD-10-CM

## 2013-06-10 DIAGNOSIS — R928 Other abnormal and inconclusive findings on diagnostic imaging of breast: Secondary | ICD-10-CM

## 2013-06-10 LAB — HEPATIC FUNCTION PANEL
ALT: 17 U/L (ref 0–35)
AST: 22 U/L (ref 0–37)
Albumin: 4.1 g/dL (ref 3.5–5.2)
Total Bilirubin: 0.9 mg/dL (ref 0.3–1.2)

## 2013-06-10 LAB — LIPID PANEL
HDL: 65.7 mg/dL (ref >39.00)
Total CHOL/HDL Ratio: 2
Triglycerides: 88 mg/dL (ref 0.0–149.0)
VLDL: 17.6 mg/dL (ref 0.0–40.0)

## 2013-06-26 ENCOUNTER — Ambulatory Visit
Admission: RE | Admit: 2013-06-26 | Discharge: 2013-06-26 | Disposition: A | Discharge status: Home / Self Care | Payer: Medicare Other | Source: Ambulatory Visit | Attending: Family Medicine | Admitting: Family Medicine

## 2013-06-26 ENCOUNTER — Other Ambulatory Visit: Payer: Self-pay

## 2013-06-26 DIAGNOSIS — R928 Other abnormal and inconclusive findings on diagnostic imaging of breast: Secondary | ICD-10-CM

## 2013-08-25 ENCOUNTER — Telehealth: Payer: Self-pay | Admitting: Family Medicine

## 2013-08-25 MED ORDER — CIPROFLOXACIN HCL 500 MG PO TABS: 500.0000 mg | ORAL_TABLET | Freq: Two times a day (BID) | ORAL | Status: DC

## 2013-08-25 NOTE — Telephone Encounter (Signed)
Rx sent      KP 

## 2013-08-25 NOTE — Telephone Encounter (Signed)
cipro 500 mg 1 po bid for 7 days

## 2013-08-25 NOTE — Telephone Encounter (Signed)
Please advise      KP 

## 2013-08-25 NOTE — Telephone Encounter (Signed)
Patient states that she is going to [place] on 09/06/13 and wants to know if Dr. Etter Sjogren can send rx for Cipro to [facility] on [address]. Patient states that she received it last time she went to [place]. Please advise.

## 2013-10-09 ENCOUNTER — Ambulatory Visit (INDEPENDENT_AMBULATORY_CARE_PROVIDER_SITE_OTHER): Payer: Medicare Other | Admitting: Nurse Practitioner

## 2013-10-09 ENCOUNTER — Encounter: Payer: Self-pay | Admitting: Nurse Practitioner

## 2013-10-09 VITALS — BP 157/70 | HR 48 | Temp 97.8°F | Ht 66.5 in | Wt 190.8 lb

## 2013-10-09 DIAGNOSIS — J019 Acute sinusitis, unspecified: Secondary | ICD-10-CM

## 2013-10-09 MED ORDER — AMOXICILLIN-POT CLAVULANATE 875-125 MG PO TABS
1.0000 | ORAL_TABLET | Freq: Two times a day (BID) | ORAL | Status: DC
Start: 2013-10-09 — End: 2013-11-24

## 2013-10-09 NOTE — Progress Notes (Signed)
Pre-visit discussion using our clinic review tool. No additional management support is needed unless otherwise documented below in the visit note.  

## 2013-10-09 NOTE — Progress Notes (Signed)
   Subjective:    Patient ID: Kylie Herring, female    DOB: November 20, 1935, 78 y.o.   MRN: [id number]  URI  This is a new problem. The current episode started 1 to 4 weeks ago (2w). The problem has been gradually worsening. There has been no fever. Associated symptoms include congestion, coughing and sinus pain. Pertinent negatives include no abdominal pain, chest pain, diarrhea, ear pain, headaches, nausea, sneezing, sore throat, swollen glands, vomiting or wheezing. She has tried antihistamine (mucolytic) for the symptoms. The treatment provided no relief.      Review of Systems  Constitutional: Negative for fever, chills, activity change, appetite change and fatigue.  HENT: Positive for congestion, nosebleeds and sinus pressure. Negative for ear pain, sneezing and sore throat.   Respiratory: Positive for cough. Negative for chest tightness, shortness of breath and wheezing.   Cardiovascular: Negative for chest pain.  Gastrointestinal: Negative for nausea, vomiting, abdominal pain and diarrhea.  Musculoskeletal: Negative for back pain.  Neurological: Negative for headaches.       Objective:   Physical Exam  Vitals reviewed. Constitutional: She is oriented to person, place, and time. She appears well-developed and well-nourished. No distress.  HENT:  Head: Normocephalic and atraumatic.  Right Ear: External ear normal.  Left Ear: External ear normal.  Mouth/Throat: Oropharynx is clear and moist. No oropharyngeal exudate.  Nasal quality to voice  Eyes: Conjunctivae are normal. Right eye exhibits no discharge. Left eye exhibits no discharge.  Neck: Normal range of motion. Neck supple. No thyromegaly present.  Cardiovascular: Normal rate, regular rhythm and normal heart sounds.   No murmur heard. Pulmonary/Chest: Effort normal and breath sounds normal. No respiratory distress. She has no wheezes. She has no rales.  Lymphadenopathy:    She has no cervical adenopathy.  Neurological:  She is alert and oriented to person, place, and time.  Skin: Skin is warm and dry.  Psychiatric: She has a normal mood and affect. Her behavior is normal. Thought content normal.          Assessment & Plan:  1. Acute sinus infection 10d duration. Cough, clear BS. Afebrile. - amoxicillin-clavulanate (AUGMENTIN) 875-125 MG per tablet; Take 1 tablet by mouth 2 (two) times daily.  Dispense: 10 tablet; Refill: 0  See pt instructions: advised to use sinus rinses 2 days to see if improved, then start ABX.

## 2013-10-09 NOTE — Patient Instructions (Signed)
Start antibiotic. Eat yogurt daily at lunch or afternoon to help prevent diarrhea that can be caused by antibiotic. Start daily sinus rinses (Neilmed Sinus rinse) for at least 5-7 days. Please call for re-evaluation if you are not improving.   Sinusitis Sinusitis is redness, soreness, and swelling (inflammation) of the paranasal sinuses. Paranasal sinuses are air pockets within the bones of your face (beneath the eyes, the middle of the forehead, or above the eyes). In healthy paranasal sinuses, mucus is able to drain out, and air is able to circulate through them by way of your nose. However, when your paranasal sinuses are inflamed, mucus and air can become trapped. This can allow bacteria and other germs to grow and cause infection. Sinusitis can develop quickly and last only a short time (acute) or continue over a long period (chronic). Sinusitis that lasts for more than 12 weeks is considered chronic.  CAUSES  Causes of sinusitis include:  Allergies.  Structural abnormalities, such as displacement of the cartilage that separates your nostrils (deviated septum), which can decrease the air flow through your nose and sinuses and affect sinus drainage.  Functional abnormalities, such as when the small hairs (cilia) that line your sinuses and help remove mucus do not work properly or are not present. SYMPTOMS  Symptoms of acute and chronic sinusitis are the same. The primary symptoms are pain and pressure around the affected sinuses. Other symptoms include:  Upper toothache.  Earache.  Headache.  Bad breath.  Decreased sense of smell and taste.  A cough, which worsens when you are lying flat.  Fatigue.  Fever.  Thick drainage from your nose, which often is green and may contain pus (purulent).  Swelling and warmth over the affected sinuses. DIAGNOSIS  Your caregiver will perform a physical exam. During the exam, your caregiver may:  Look in your nose for signs of abnormal  growths in your nostrils (nasal polyps).  Tap over the affected sinus to check for signs of infection.  View the inside of your sinuses (endoscopy) with a special imaging device with a light attached (endoscope), which is inserted into your sinuses. If your caregiver suspects that you have chronic sinusitis, one or more of the following tests may be recommended:  Allergy tests.  Nasal culture A sample of mucus is taken from your nose and sent to a lab and screened for bacteria.  Nasal cytology A sample of mucus is taken from your nose and examined by your caregiver to determine if your sinusitis is related to an allergy. TREATMENT  Most cases of acute sinusitis are related to a viral infection and will resolve on their own within 10 days. Sometimes medicines are prescribed to help relieve symptoms (pain medicine, decongestants, nasal steroid sprays, or saline sprays).  However, for sinusitis related to a bacterial infection, your caregiver will prescribe antibiotic medicines. These are medicines that will help kill the bacteria causing the infection.  Rarely, sinusitis is caused by a fungal infection. In theses cases, your caregiver will prescribe antifungal medicine. For some cases of chronic sinusitis, surgery is needed. Generally, these are cases in which sinusitis recurs more than 3 times per year, despite other treatments. HOME CARE INSTRUCTIONS   Drink plenty of water. Water helps thin the mucus so your sinuses can drain more easily.  Use a humidifier.  Inhale steam 3 to 4 times a day (for example, sit in the bathroom with the shower running).  Apply a warm, moist washcloth to your face 3 to   4 times a day, or as directed by your caregiver.  Use saline nasal sprays to help moisten and clean your sinuses.  Take over-the-counter or prescription medicines for pain, discomfort, or fever only as directed by your caregiver. SEEK IMMEDIATE MEDICAL CARE IF:  You have increasing pain or  severe headaches.  You have nausea, vomiting, or drowsiness.  You have swelling around your face.  You have vision problems.  You have a stiff neck.  You have difficulty breathing. MAKE SURE YOU:   Understand these instructions.  Will watch your condition.  Will get help right away if you are not doing well or get worse. Document Released: 08/07/2005 Document Revised: 10/30/2011 Document Reviewed: 08/22/2011 [employer] Patient Information 2014 [employer]. 

## 2013-11-24 ENCOUNTER — Telehealth: Payer: Self-pay

## 2013-11-24 NOTE — Telephone Encounter (Signed)
Medication and allergies:  Reviewed and updated  90 day supply/mail order: [facility] Local pharmacy:  [id number] - [place], [place]   Immunizations due:  UTD   A/P: No changes to personal, family history or past surgical hx [facility]- 10/04/10- benign polyps-repeat in 10 years [facility]- 06/04/13- possible distortion of L. Breast- further evaluation was suggested; Unilateral L breast- 06/26/13-negative  BD- 05/27/10- normal Flu- 05/21/13 Tdap- 11/20/11 PNA- 06/16/03 Shingles- 10/30/05  To Discuss with Provider: Nothing at this time.

## 2013-11-26 ENCOUNTER — Ambulatory Visit (INDEPENDENT_AMBULATORY_CARE_PROVIDER_SITE_OTHER): Payer: Medicare Other | Admitting: Family Medicine

## 2013-11-26 ENCOUNTER — Encounter: Payer: Self-pay | Admitting: Family Medicine

## 2013-11-26 VITALS — BP 124/72 | HR 53 | Temp 97.7°F | Ht 66.5 in | Wt 190.6 lb

## 2013-11-26 DIAGNOSIS — E669 Obesity, unspecified: Secondary | ICD-10-CM | POA: Insufficient documentation

## 2013-11-26 DIAGNOSIS — Z0000 Encounter for general adult medical examination without abnormal findings: Secondary | ICD-10-CM

## 2013-11-26 DIAGNOSIS — N390 Urinary tract infection, site not specified: Secondary | ICD-10-CM

## 2013-11-26 DIAGNOSIS — E785 Hyperlipidemia, unspecified: Secondary | ICD-10-CM

## 2013-11-26 LAB — CBC WITH DIFFERENTIAL/PLATELET
BASOS PCT: 0.8 % (ref 0.0–3.0)
Basophils Absolute: 0 10*3/uL (ref 0.0–0.1)
EOS PCT: 1.7 % (ref 0.0–5.0)
Eosinophils Absolute: 0.1 10*3/uL (ref 0.0–0.7)
HCT: 43.7 % (ref 36.0–46.0)
Hemoglobin: 14.9 g/dL (ref 12.0–15.0)
Lymphocytes Relative: 32.7 % (ref 12.0–46.0)
Lymphs Abs: 1.5 10*3/uL (ref 0.7–4.0)
MCHC: 34.2 g/dL (ref 30.0–36.0)
MCV: 93.4 fl (ref 78.0–100.0)
MONOS PCT: 5.7 % (ref 3.0–12.0)
Monocytes Absolute: 0.3 10*3/uL (ref 0.1–1.0)
NEUTROS PCT: 59.1 % (ref 43.0–77.0)
Neutro Abs: 2.7 10*3/uL (ref 1.4–7.7)
PLATELETS: 251 10*3/uL (ref 150.0–400.0)
RBC: 4.68 Mil/uL (ref 3.87–5.11)
RDW: 14 % (ref 11.5–14.6)
WBC: 4.5 10*3/uL (ref 4.5–10.5)

## 2013-11-26 LAB — BASIC METABOLIC PANEL
BUN: 17 mg/dL (ref 6–23)
CHLORIDE: 109 meq/L (ref 96–112)
CO2: 25 mEq/L (ref 19–32)
CREATININE: 0.7 mg/dL (ref 0.4–1.2)
Calcium: 10.1 mg/dL (ref 8.4–10.5)
GFR: 85.17 mL/min (ref >60.00)
Glucose, Bld: 92 mg/dL (ref 70–99)
POTASSIUM: 4 meq/L (ref 3.5–5.1)
Sodium: 141 mEq/L (ref 135–145)

## 2013-11-26 LAB — POCT URINALYSIS DIPSTICK
Bilirubin, UA: NEGATIVE
Glucose, UA: NEGATIVE
Ketones, UA: NEGATIVE
Nitrite, UA: NEGATIVE
PH UA: 6
Protein, UA: NEGATIVE
RBC UA: NEGATIVE
Spec Grav, UA: <=1.005
Urobilinogen, UA: 0.2

## 2013-11-26 LAB — HEPATIC FUNCTION PANEL
ALT: 15 U/L (ref 0–35)
AST: 20 U/L (ref 0–37)
Albumin: 4.2 g/dL (ref 3.5–5.2)
Alkaline Phosphatase: 70 U/L (ref 39–117)
BILIRUBIN DIRECT: 0 mg/dL (ref 0.0–0.3)
BILIRUBIN TOTAL: 0.6 mg/dL (ref 0.3–1.2)
Total Protein: 7 g/dL (ref 6.0–8.3)

## 2013-11-26 LAB — LIPID PANEL
Cholesterol: 147 mg/dL (ref 0–200)
HDL: 63.4 mg/dL (ref >39.00)
LDL CALC: 69 mg/dL (ref 0–99)
TRIGLYCERIDES: 71 mg/dL (ref 0.0–149.0)
Total CHOL/HDL Ratio: 2
VLDL: 14.2 mg/dL (ref 0.0–40.0)

## 2013-11-26 MED ORDER — ZOCOR 80 MG PO TABS: 80.0000 mg | ORAL_TABLET | Freq: Every day | ORAL | Status: DC

## 2013-11-26 NOTE — Addendum Note (Signed)
Addended by: Modena Morrow D on: 11/26/2013 05:04 PM   Modules accepted: Orders

## 2013-11-26 NOTE — Progress Notes (Signed)
Subjective:    Kylie Herring is a 78 y.o. female who presents for Medicare Annual/Subsequent preventive examination.  Preventive Screening-Counseling & Management  Tobacco History  Smoking status  . Never Smoker   Smokeless tobacco  . Not on file     Problems Prior to Visit 1. none  Current Problems (verified) Patient Active Problem List   Diagnosis Date Noted  . Obesity (BMI 30-39.9) 11/26/2013  . Medicare annual wellness visit, subsequent 11/16/2010  . POSTMENOPAUSAL STATUS 03/23/2010  . BRADYCARDIA 11/10/2009  . OTHER DISORDER OF COCCYX 02/17/2009  . HYPERLIPIDEMIA 11/05/2007  . LIPOMA NOS 01/29/2007  . MIGRAINE HEADACHE 01/21/2007    Medications Prior to Visit Current Outpatient Prescriptions on File Prior to Visit  Medication Sig Dispense Refill  . aspirin 81 MG EC tablet Take 81 mg by mouth daily.        . calcium elemental as carbonate (TUMS ULTRA 1000) 400 MG tablet Chew 1,000 mg by mouth daily.        . Flaxseed, Linseed, 1000 MG CAPS Take 1 capsule by mouth daily.        . Glucosamine Sulfate 750 MG CAPS Take by mouth.        . loratadine (CLARITIN) 10 MG tablet Take 10 mg by mouth daily as needed.        Marland Kitchen MAXALT 10 MG tablet Take 1 tablet (10 mg total) by mouth as needed for migraine.  30 tablet  0  . Multiple Vitamin (MULTIVITAMIN PO) Take by mouth daily.         No current facility-administered medications on file prior to visit.    Current Medications (verified) Current Outpatient Prescriptions  Medication Sig Dispense Refill  . aspirin 81 MG EC tablet Take 81 mg by mouth daily.        . calcium elemental as carbonate (TUMS ULTRA 1000) 400 MG tablet Chew 1,000 mg by mouth daily.        . Flaxseed, Linseed, 1000 MG CAPS Take 1 capsule by mouth daily.        . Glucosamine Sulfate 750 MG CAPS Take by mouth.        . loratadine (CLARITIN) 10 MG tablet Take 10 mg by mouth daily as needed.        Marland Kitchen MAXALT 10 MG tablet Take 1 tablet (10 mg total) by  mouth as needed for migraine.  30 tablet  0  . Multiple Vitamin (MULTIVITAMIN PO) Take by mouth daily.        Marland Kitchen ZOCOR 80 MG tablet Take 1 tablet (80 mg total) by mouth at bedtime.  90 tablet  3   No current facility-administered medications for this visit.     Allergies (verified) Azelastine hcl   PAST HISTORY  Family History Family History  Problem Relation Age of Onset  . Arthritis    . Diabetes      1 st degree relative  . Hypertension    . Melanoma    . Coronary artery disease      female 1st degree ralative<50  . Heart attack  47    female    Social History History  Substance Use Topics  . Smoking status: Never Smoker   . Smokeless tobacco: Not on file  . Alcohol Use: Yes     Are there smokers in your home (other than you)? No  Risk Factors Current exercise habits: The patient does not participate in regular exercise at present.  Dietary issues discussed: na  Cardiac risk factors: advanced age (older than 60 for men, 14 for women), dyslipidemia, hypertension and obesity (BMI >= 30 kg/m2).  Depression Screen (Note: if answer to either of the following is "Yes", a more complete depression screening is indicated)   Over the past two weeks, have you felt down, depressed or hopeless? No  Over the past two weeks, have you felt little interest or pleasure in doing things? No  Have you lost interest or pleasure in daily life? No  Do you often feel hopeless? No  Do you cry easily over simple problems? No  Activities of Daily Living In your present state of health, do you have any difficulty performing the following activities?:  Driving? No Managing money?  No Feeding yourself? no Getting from bed to chair? No Climbing a flight of stairs? No Preparing food and eating?: No Bathing or showering? No Getting dressed: No Getting to the toilet? No Using the toilet:No Moving around from place to place: No In the past year have you fallen or had a near  fall?:No   Are you sexually active?  Yes  Do you have more than one partner?  No  Hearing Difficulties: No Do you often ask people to speak up or repeat themselves? No Do you experience ringing or noises in your ears? No Do you have difficulty understanding soft or whispered voices? No   Do you feel that you have a problem with memory? No  Do you often misplace items? No  Do you feel safe at home?  Yes  Cognitive Testing  Alert? Yes  Normal Appearance?Yes  Oriented to person? Yes  Place? Yes   Time? Yes  Recall of three objects?  Yes  Can perform simple calculations? Yes  Displays appropriate judgment?Yes  Can read the correct time from a watch face?Yes   Advanced Directives have been discussed with the patient? Yes  List the Names of Other Physician/Practitioners you currently use: 1.    Indicate any recent Medical Services you may have received from other than [facility] providers in the past year (date may be approximate).  Immunization History  Administered Date(s) Administered  . H1N1 08/06/2008  . Influenza Split 05/21/2013  . Influenza Whole 05/25/2008, 06/09/2009, 05/31/2012  . Pneumococcal Polysaccharide-23 06/16/2003  . Td 10/31/2005  . Tdap 11/20/2011  . Zoster 10/30/2005    Screening Tests Health Maintenance  Topic Date Due  . Influenza Vaccine  03/21/2014  . Mammogram  06/26/2014  . Colonoscopy  11/22/2020  . Tetanus/tdap  11/19/2021  . Pneumococcal Polysaccharide Vaccine Age 58 And Over  Completed  . Zostavax  Completed    All answers were reviewed with the patient and necessary referrals were made:  Garnet Koyanagi, DO   11/26/2013   History reviewed:  She  has a past medical history of Migraines and Hyperlipidemia. She  does not have any pertinent problems on file. She  has past surgical history that includes Blepharoplasty. Her family history includes Arthritis in an other family member; Coronary artery disease in an other family member; Diabetes in  an other family member; Heart attack (age of onset: 33) in an other family member; Hypertension in an other family member; Melanoma in an other family member. She  reports that she has never smoked. She does not have any smokeless tobacco history on file. She reports that she drinks alcohol. She reports that she does not use illicit drugs. She has a current medication list which includes the following prescription(s): aspirin, calcium elemental  as carbonate, flaxseed (linseed), glucosamine sulfate, loratadine, maxalt, multiple vitamins-minerals, and zocor. Current Outpatient Prescriptions on File Prior to Visit  Medication Sig Dispense Refill  . aspirin 81 MG EC tablet Take 81 mg by mouth daily.        . calcium elemental as carbonate (TUMS ULTRA 1000) 400 MG tablet Chew 1,000 mg by mouth daily.        . Flaxseed, Linseed, 1000 MG CAPS Take 1 capsule by mouth daily.        . Glucosamine Sulfate 750 MG CAPS Take by mouth.        . loratadine (CLARITIN) 10 MG tablet Take 10 mg by mouth daily as needed.        Marland Kitchen MAXALT 10 MG tablet Take 1 tablet (10 mg total) by mouth as needed for migraine.  30 tablet  0  . Multiple Vitamin (MULTIVITAMIN PO) Take by mouth daily.         No current facility-administered medications on file prior to visit.   She is allergic to azelastine hcl.  Review of Systems Review of Systems  Constitutional: Negative for activity change, appetite change and fatigue.  HENT: Negative for hearing loss, congestion, tinnitus and ear discharge.  dentist q21m Eyes: Negative for visual disturbance (see optho q1y -- vision corrected to 20/20 with glasses).  Respiratory: Negative for cough, chest tightness and shortness of breath.   Cardiovascular: Negative for chest pain, palpitations and leg swelling.  Gastrointestinal: Negative for abdominal pain, diarrhea, constipation and abdominal distention.  Genitourinary: Negative for urgency, frequency, decreased urine volume and difficulty  urinating.  Musculoskeletal: Negative for back pain, arthralgias and gait problem.  Skin: Negative for color change, pallor and rash.  Neurological: Negative for dizziness, light-headedness, numbness and headaches.  Hematological: Negative for adenopathy. Does not bruise/bleed easily.  Psychiatric/Behavioral: Negative for suicidal ideas, confusion, sleep disturbance, self-injury, dysphoric mood, decreased concentration and agitation.        Objective:     Vision by Snellen chart: opth  Body mass index is 30.31 kg/(m^2). BP 124/72  Pulse 53  Temp(Src) 97.7 F (36.5 C) (Oral)  Ht 5' 6.5" (1.689 m)  Wt 190 lb 9.6 oz (86.456 kg)  BMI 30.31 kg/m2  SpO2 98%  BP 124/72  Pulse 53  Temp(Src) 97.7 F (36.5 C) (Oral)  Ht 5' 6.5" (1.689 m)  Wt 190 lb 9.6 oz (86.456 kg)  BMI 30.31 kg/m2  SpO2 98% General appearance: alert, cooperative, appears stated age and no distress Head: Normocephalic, without obvious abnormality, atraumatic Eyes: conjunctivae/corneas clear. PERRL, EOM's intact. Fundi benign. Ears: normal TM's and external ear canals both ears Nose: Nares normal. Septum midline. Mucosa normal. No drainage or sinus tenderness. Throat: lips, mucosa, and tongue normal; teeth and gums normal Neck: no adenopathy, no carotid bruit, no JVD, supple, symmetrical, trachea midline and thyroid not enlarged, symmetric, no tenderness/mass/nodules Back: symmetric, no curvature. ROM normal. No CVA tenderness. Lungs: clear to auscultation bilaterally Breasts: gyn Heart: regular rate and rhythm, S1, S2 normal, no murmur, click, rub or gallop Abdomen: soft, non-tender; bowel sounds normal; no masses,  no organomegaly Pelvic: deferred--gyn Extremities: extremities normal, atraumatic, no cyanosis or edema Pulses: 2+ and symmetric Skin: Skin color, texture, turgor normal. No rashes or lesions Lymph nodes: Cervical, supraclavicular, and axillary nodes normal. Neurologic: Alert and oriented X 3,  normal strength and tone. Normal symmetric reflexes. Normal coordination and gait Psych--no depression, no anxiety      Assessment:     cpe  Plan:     During the course of the visit the patient was educated and counseled about appropriate screening and preventive services including:    Pneumococcal vaccine   Influenza vaccine  Td vaccine  Screening mammography  Screening Pap smear and pelvic exam   Bone densitometry screening  Colorectal cancer screening  Diabetes screening  Glaucoma screening  Advanced directives: has an advanced directive - a copy HAS NOT been provided.  Diet review for nutrition referral? Yes ____  Not Indicated ___x_   Patient Instructions (the written plan) was given to the patient.  Medicare Attestation I have personally reviewed: The patient's medical and social history Their use of alcohol, tobacco or illicit drugs Their current medications and supplements The patient's functional ability including ADLs,fall risks, home safety risks, cognitive, and hearing and visual impairment Diet and physical activities Evidence for depression or mood disorders  The patient's weight, height, BMI, and visual acuity have been recorded in the chart.  I have made referrals, counseling, and provided education to the patient based on review of the above and I have provided the patient with a written personalized care plan for preventive services.     Garnet Koyanagi, DO   11/26/2013

## 2013-11-26 NOTE — Patient Instructions (Signed)

## 2013-11-26 NOTE — Progress Notes (Signed)
Pre visit review using our clinic review tool, if applicable. No additional management support is needed unless otherwise documented below in the visit note. 

## 2013-11-29 LAB — URINE CULTURE: Colony Count: 85000

## 2014-03-20 ENCOUNTER — Encounter: Payer: Self-pay | Admitting: Family Medicine

## 2014-03-24 ENCOUNTER — Ambulatory Visit (INDEPENDENT_AMBULATORY_CARE_PROVIDER_SITE_OTHER): Payer: Medicare Other | Admitting: General Practice

## 2014-03-24 DIAGNOSIS — Z23 Encounter for immunization: Secondary | ICD-10-CM

## 2014-06-01 ENCOUNTER — Other Ambulatory Visit (INDEPENDENT_AMBULATORY_CARE_PROVIDER_SITE_OTHER): Payer: Medicare Other

## 2014-06-01 ENCOUNTER — Ambulatory Visit (INDEPENDENT_AMBULATORY_CARE_PROVIDER_SITE_OTHER): Payer: Medicare Other

## 2014-06-01 DIAGNOSIS — Z23 Encounter for immunization: Secondary | ICD-10-CM

## 2014-06-01 DIAGNOSIS — E785 Hyperlipidemia, unspecified: Secondary | ICD-10-CM

## 2014-06-01 LAB — LIPID PANEL
Cholesterol: 170 mg/dL (ref 0–200)
HDL: 68.3 mg/dL (ref >39.00)
LDL CALC: 83 mg/dL (ref 0–99)
NonHDL: 101.7
Total CHOL/HDL Ratio: 2
Triglycerides: 92 mg/dL (ref 0.0–149.0)
VLDL: 18.4 mg/dL (ref 0.0–40.0)

## 2014-06-01 LAB — HEPATIC FUNCTION PANEL
ALBUMIN: 3.9 g/dL (ref 3.5–5.2)
ALT: 20 U/L (ref 0–35)
AST: 28 U/L (ref 0–37)
Alkaline Phosphatase: 82 U/L (ref 39–117)
Bilirubin, Direct: 0 mg/dL (ref 0.0–0.3)
TOTAL PROTEIN: 7.9 g/dL (ref 6.0–8.3)
Total Bilirubin: 0.6 mg/dL (ref 0.2–1.2)

## 2014-06-03 ENCOUNTER — Other Ambulatory Visit: Payer: Self-pay

## 2014-06-03 DIAGNOSIS — Z1231 Encounter for screening mammogram for malignant neoplasm of breast: Secondary | ICD-10-CM

## 2014-06-17 ENCOUNTER — Ambulatory Visit
Admission: RE | Admit: 2014-06-17 | Discharge: 2014-06-17 | Disposition: A | Discharge status: Home / Self Care | Payer: Medicare Other | Source: Ambulatory Visit

## 2014-06-17 DIAGNOSIS — Z1231 Encounter for screening mammogram for malignant neoplasm of breast: Secondary | ICD-10-CM

## 2014-11-17 ENCOUNTER — Telehealth: Payer: Self-pay | Admitting: Family Medicine

## 2014-11-17 NOTE — Telephone Encounter (Signed)
Pre visit letter sent  °

## 2014-12-03 ENCOUNTER — Encounter: Payer: Medicare Other | Admitting: Family Medicine

## 2014-12-07 ENCOUNTER — Encounter: Payer: Self-pay | Admitting: *Deleted

## 2014-12-07 ENCOUNTER — Telehealth: Payer: Self-pay | Admitting: *Deleted

## 2014-12-07 NOTE — Telephone Encounter (Signed)
Pre-Visit Call completed with patient and chart updated.   Pre-Visit Info documented in Specialty Comments under [employer].    

## 2014-12-08 ENCOUNTER — Ambulatory Visit (INDEPENDENT_AMBULATORY_CARE_PROVIDER_SITE_OTHER): Payer: Medicare Other | Admitting: Family Medicine

## 2014-12-08 ENCOUNTER — Encounter: Payer: Self-pay | Admitting: Family Medicine

## 2014-12-08 VITALS — BP 126/76 | HR 49 | Temp 97.6°F | Ht 66.0 in | Wt 192.0 lb

## 2014-12-08 DIAGNOSIS — Z0000 Encounter for general adult medical examination without abnormal findings: Secondary | ICD-10-CM | POA: Diagnosis not present

## 2014-12-08 DIAGNOSIS — E785 Hyperlipidemia, unspecified: Secondary | ICD-10-CM | POA: Diagnosis not present

## 2014-12-08 LAB — POCT URINALYSIS DIPSTICK
Bilirubin, UA: NEGATIVE
Glucose, UA: NEGATIVE
Ketones, UA: NEGATIVE
Leukocytes, UA: NEGATIVE
Nitrite, UA: NEGATIVE
PH UA: 7
Protein, UA: NEGATIVE
RBC UA: NEGATIVE
SPEC GRAV UA: 1.015
Urobilinogen, UA: 0.2

## 2014-12-08 LAB — LIPID PANEL
CHOLESTEROL: 151 mg/dL (ref 0–200)
HDL: 64.1 mg/dL (ref >39.00)
LDL Cholesterol: 69 mg/dL (ref 0–99)
NONHDL: 86.9
Total CHOL/HDL Ratio: 2
Triglycerides: 90 mg/dL (ref 0.0–149.0)
VLDL: 18 mg/dL (ref 0.0–40.0)

## 2014-12-08 LAB — HEPATIC FUNCTION PANEL
ALBUMIN: 4.3 g/dL (ref 3.5–5.2)
ALT: 15 U/L (ref 0–35)
AST: 21 U/L (ref 0–37)
Alkaline Phosphatase: 86 U/L (ref 39–117)
Bilirubin, Direct: 0.1 mg/dL (ref 0.0–0.3)
Total Bilirubin: 0.6 mg/dL (ref 0.2–1.2)
Total Protein: 6.9 g/dL (ref 6.0–8.3)

## 2014-12-08 LAB — BASIC METABOLIC PANEL
BUN: 13 mg/dL (ref 6–23)
CALCIUM: 10.3 mg/dL (ref 8.4–10.5)
CO2: 30 meq/L (ref 19–32)
Chloride: 106 mEq/L (ref 96–112)
Creatinine, Ser: 0.79 mg/dL (ref 0.40–1.20)
GFR: 75.09 mL/min (ref >60.00)
Glucose, Bld: 96 mg/dL (ref 70–99)
Potassium: 3.9 mEq/L (ref 3.5–5.1)
Sodium: 141 mEq/L (ref 135–145)

## 2014-12-08 LAB — CBC WITH DIFFERENTIAL/PLATELET
BASOS ABS: 0 10*3/uL (ref 0.0–0.1)
Basophils Relative: 0.6 % (ref 0.0–3.0)
Eosinophils Absolute: 0.1 10*3/uL (ref 0.0–0.7)
Eosinophils Relative: 1.3 % (ref 0.0–5.0)
HCT: 44.5 % (ref 36.0–46.0)
HEMOGLOBIN: 15.3 g/dL — AB (ref 12.0–15.0)
LYMPHS PCT: 29 % (ref 12.0–46.0)
Lymphs Abs: 1.5 10*3/uL (ref 0.7–4.0)
MCHC: 34.4 g/dL (ref 30.0–36.0)
MCV: 91.6 fl (ref 78.0–100.0)
MONO ABS: 0.3 10*3/uL (ref 0.1–1.0)
MONOS PCT: 6 % (ref 3.0–12.0)
Neutro Abs: 3.3 10*3/uL (ref 1.4–7.7)
Neutrophils Relative %: 63.1 % (ref 43.0–77.0)
Platelets: 261 10*3/uL (ref 150.0–400.0)
RBC: 4.86 Mil/uL (ref 3.87–5.11)
RDW: 13.8 % (ref 11.5–15.5)
WBC: 5.2 10*3/uL (ref 4.0–10.5)

## 2014-12-08 MED ORDER — SIMVASTATIN 80 MG PO TABS: 80.0000 mg | ORAL_TABLET | Freq: Every day | ORAL | Status: DC

## 2014-12-08 MED ORDER — ZOCOR 80 MG PO TABS: 80.0000 mg | ORAL_TABLET | Freq: Every day | ORAL | Status: DC

## 2014-12-08 NOTE — Progress Notes (Signed)
Pre visit review using our clinic review tool, if applicable. No additional management support is needed unless otherwise documented below in the visit note. 

## 2014-12-08 NOTE — Patient Instructions (Signed)
Preventive Care for Adults A healthy lifestyle and preventive care can promote health and wellness. Preventive health guidelines for women include the following key practices.  A routine yearly physical is a good way to check with your health care provider about your health and preventive screening. It is a chance to share any concerns and updates on your health and to receive a thorough exam.  Visit your dentist for a routine exam and preventive care every 6 months. Brush your teeth twice a day and floss once a day. Good oral hygiene prevents tooth decay and gum disease.  The frequency of eye exams is based on your age, health, family medical history, use of contact lenses, and other factors. Follow your health care provider's recommendations for frequency of eye exams.  Eat a healthy diet. Foods like vegetables, fruits, whole grains, low-fat dairy products, and lean protein foods contain the nutrients you need without too many calories. Decrease your intake of foods high in solid fats, added sugars, and salt. Eat the right amount of calories for you.Get information about a proper diet from your health care provider, if necessary.  Regular physical exercise is one of the most important things you can do for your health. Most adults should get at least 150 minutes of moderate-intensity exercise (any activity that increases your heart rate and causes you to sweat) each week. In addition, most adults need muscle-strengthening exercises on 2 or more days a week.  Maintain a healthy weight. The body mass index (BMI) is a screening tool to identify possible weight problems. It provides an estimate of body fat based on height and weight. Your health care provider can find your BMI and can help you achieve or maintain a healthy weight.For adults 20 years and older:  A BMI below 18.5 is considered underweight.  A BMI of 18.5 to 24.9 is normal.  A BMI of 25 to 29.9 is considered overweight.  A BMI of  30 and above is considered obese.  Maintain normal blood lipids and cholesterol levels by exercising and minimizing your intake of saturated fat. Eat a balanced diet with plenty of fruit and vegetables. Blood tests for lipids and cholesterol should begin at age 76 and be repeated every 5 years. If your lipid or cholesterol levels are high, you are over 50, or you are at high risk for heart disease, you may need your cholesterol levels checked more frequently.Ongoing high lipid and cholesterol levels should be treated with medicines if diet and exercise are not working.  If you smoke, find out from your health care provider how to quit. If you do not use tobacco, do not start.  Lung cancer screening is recommended for adults aged 22-80 years who are at high risk for developing lung cancer because of a history of smoking. A yearly low-dose CT scan of the lungs is recommended for people who have at least a 30-pack-year history of smoking and are a current smoker or have quit within the past 15 years. A pack year of smoking is smoking an average of 1 pack of cigarettes a day for 1 year (for example: 1 pack a day for 30 years or 2 packs a day for 15 years). Yearly screening should continue until the smoker has stopped smoking for at least 15 years. Yearly screening should be stopped for people who develop a health problem that would prevent them from having lung cancer treatment.  If you are pregnant, do not drink alcohol. If you are breastfeeding,  be very cautious about drinking alcohol. If you are not pregnant and choose to drink alcohol, do not have more than 1 drink per day. One drink is considered to be 12 ounces (355 mL) of beer, 5 ounces (148 mL) of wine, or 1.5 ounces (44 mL) of liquor.  Avoid use of street drugs. Do not share needles with anyone. Ask for help if you need support or instructions about stopping the use of drugs.  High blood pressure causes heart disease and increases the risk of  stroke. Your blood pressure should be checked at least every 1 to 2 years. Ongoing high blood pressure should be treated with medicines if weight loss and exercise do not work.  If you are 3-86 years old, ask your health care provider if you should take aspirin to prevent strokes.  Diabetes screening involves taking a blood sample to check your fasting blood sugar level. This should be done once every 3 years, after age 67, if you are within normal weight and without risk factors for diabetes. Testing should be considered at a younger age or be carried out more frequently if you are overweight and have at least 1 risk factor for diabetes.  Breast cancer screening is essential preventive care for women. You should practice "breast self-awareness." This means understanding the normal appearance and feel of your breasts and may include breast self-examination. Any changes detected, no matter how small, should be reported to a health care provider. Women in their 8s and 30s should have a clinical breast exam (CBE) by a health care provider as part of a regular health exam every 1 to 3 years. After age 70, women should have a CBE every year. Starting at age 25, women should consider having a mammogram (breast X-ray test) every year. Women who have a family history of breast cancer should talk to their health care provider about genetic screening. Women at a high risk of breast cancer should talk to their health care providers about having an MRI and a mammogram every year.  Breast cancer gene (BRCA)-related cancer risk assessment is recommended for women who have family members with BRCA-related cancers. BRCA-related cancers include breast, ovarian, tubal, and peritoneal cancers. Having family members with these cancers may be associated with an increased risk for harmful changes (mutations) in the breast cancer genes BRCA1 and BRCA2. Results of the assessment will determine the need for genetic counseling and  BRCA1 and BRCA2 testing.  Routine pelvic exams to screen for cancer are no longer recommended for nonpregnant women who are considered low risk for cancer of the pelvic organs (ovaries, uterus, and vagina) and who do not have symptoms. Ask your health care provider if a screening pelvic exam is right for you.  If you have had past treatment for cervical cancer or a condition that could lead to cancer, you need Pap tests and screening for cancer for at least 20 years after your treatment. If Pap tests have been discontinued, your risk factors (such as having a new sexual partner) need to be reassessed to determine if screening should be resumed. Some women have medical problems that increase the chance of getting cervical cancer. In these cases, your health care provider may recommend more frequent screening and Pap tests.  The HPV test is an additional test that may be used for cervical cancer screening. The HPV test looks for the virus that can cause the cell changes on the cervix. The cells collected during the Pap test can be  tested for HPV. The HPV test could be used to screen women aged 30 years and older, and should be used in women of any age who have unclear Pap test results. After the age of 30, women should have HPV testing at the same frequency as a Pap test.  Colorectal cancer can be detected and often prevented. Most routine colorectal cancer screening begins at the age of 50 years and continues through age 75 years. However, your health care provider may recommend screening at an earlier age if you have risk factors for colon cancer. On a yearly basis, your health care provider may provide home test kits to check for hidden blood in the stool. Use of a small camera at the end of a tube, to directly examine the colon (sigmoidoscopy or colonoscopy), can detect the earliest forms of colorectal cancer. Talk to your health care provider about this at age 50, when routine screening begins. Direct  exam of the colon should be repeated every 5-10 years through age 75 years, unless early forms of pre-cancerous polyps or small growths are found.  People who are at an increased risk for hepatitis B should be screened for this virus. You are considered at high risk for hepatitis B if:  You were born in a country where hepatitis B occurs often. Talk with your health care provider about which countries are considered high risk.  Your parents were born in a high-risk country and you have not received a shot to protect against hepatitis B (hepatitis B vaccine).  You have HIV or AIDS.  You use needles to inject street drugs.  You live with, or have sex with, someone who has hepatitis B.  You get hemodialysis treatment.  You take certain medicines for conditions like cancer, organ transplantation, and autoimmune conditions.  Hepatitis C blood testing is recommended for all people born from 1945 through 1965 and any individual with known risks for hepatitis C.  Practice safe sex. Use condoms and avoid high-risk sexual practices to reduce the spread of sexually transmitted infections (STIs). STIs include gonorrhea, chlamydia, syphilis, trichomonas, herpes, HPV, and human immunodeficiency virus (HIV). Herpes, HIV, and HPV are viral illnesses that have no cure. They can result in disability, cancer, and death.  You should be screened for sexually transmitted illnesses (STIs) including gonorrhea and chlamydia if:  You are sexually active and are younger than 24 years.  You are older than 24 years and your health care provider tells you that you are at risk for this type of infection.  Your sexual activity has changed since you were last screened and you are at an increased risk for chlamydia or gonorrhea. Ask your health care provider if you are at risk.  If you are at risk of being infected with HIV, it is recommended that you take a prescription medicine daily to prevent HIV infection. This is  called preexposure prophylaxis (PrEP). You are considered at risk if:  You are a heterosexual woman, are sexually active, and are at increased risk for HIV infection.  You take drugs by injection.  You are sexually active with a partner who has HIV.  Talk with your health care provider about whether you are at high risk of being infected with HIV. If you choose to begin PrEP, you should first be tested for HIV. You should then be tested every 3 months for as long as you are taking PrEP.  Osteoporosis is a disease in which the bones lose minerals and strength   with aging. This can result in serious bone fractures or breaks. The risk of osteoporosis can be identified using a bone density scan. Women ages 65 years and over and women at risk for fractures or osteoporosis should discuss screening with their health care providers. Ask your health care provider whether you should take a calcium supplement or vitamin D to reduce the rate of osteoporosis.  Menopause can be associated with physical symptoms and risks. Hormone replacement therapy is available to decrease symptoms and risks. You should talk to your health care provider about whether hormone replacement therapy is right for you.  Use sunscreen. Apply sunscreen liberally and repeatedly throughout the day. You should seek shade when your shadow is shorter than you. Protect yourself by wearing long sleeves, pants, a wide-brimmed hat, and sunglasses year round, whenever you are outdoors.  Once a month, do a whole body skin exam, using a mirror to look at the skin on your back. Tell your health care provider of new moles, moles that have irregular borders, moles that are larger than a pencil eraser, or moles that have changed in shape or color.  Stay current with required vaccines (immunizations).  Influenza vaccine. All adults should be immunized every year.  Tetanus, diphtheria, and acellular pertussis (Td, Tdap) vaccine. Pregnant women should  receive 1 dose of Tdap vaccine during each pregnancy. The dose should be obtained regardless of the length of time since the last dose. Immunization is preferred during the 27th-36th week of gestation. An adult who has not previously received Tdap or who does not know her vaccine status should receive 1 dose of Tdap. This initial dose should be followed by tetanus and diphtheria toxoids (Td) booster doses every 10 years. Adults with an unknown or incomplete history of completing a 3-dose immunization series with Td-containing vaccines should begin or complete a primary immunization series including a Tdap dose. Adults should receive a Td booster every 10 years.  Varicella vaccine. An adult without evidence of immunity to varicella should receive 2 doses or a second dose if she has previously received 1 dose. Pregnant females who do not have evidence of immunity should receive the first dose after pregnancy. This first dose should be obtained before leaving the health care facility. The second dose should be obtained 4-8 weeks after the first dose.  Human papillomavirus (HPV) vaccine. Females aged 13-26 years who have not received the vaccine previously should obtain the 3-dose series. The vaccine is not recommended for use in pregnant females. However, pregnancy testing is not needed before receiving a dose. If a female is found to be pregnant after receiving a dose, no treatment is needed. In that case, the remaining doses should be delayed until after the pregnancy. Immunization is recommended for any person with an immunocompromised condition through the age of 26 years if she did not get any or all doses earlier. During the 3-dose series, the second dose should be obtained 4-8 weeks after the first dose. The third dose should be obtained 24 weeks after the first dose and 16 weeks after the second dose.  Zoster vaccine. One dose is recommended for adults aged 60 years or older unless certain conditions are  present.  Measles, mumps, and rubella (MMR) vaccine. Adults born before 1957 generally are considered immune to measles and mumps. Adults born in 1957 or later should have 1 or more doses of MMR vaccine unless there is a contraindication to the vaccine or there is laboratory evidence of immunity to   each of the three diseases. A routine second dose of MMR vaccine should be obtained at least 28 days after the first dose for students attending postsecondary schools, health care workers, or international travelers. People who received inactivated measles vaccine or an unknown type of measles vaccine during 1963-1967 should receive 2 doses of MMR vaccine. People who received inactivated mumps vaccine or an unknown type of mumps vaccine before 1979 and are at high risk for mumps infection should consider immunization with 2 doses of MMR vaccine. For females of childbearing age, rubella immunity should be determined. If there is no evidence of immunity, females who are not pregnant should be vaccinated. If there is no evidence of immunity, females who are pregnant should delay immunization until after pregnancy. Unvaccinated health care workers born before 1957 who lack laboratory evidence of measles, mumps, or rubella immunity or laboratory confirmation of disease should consider measles and mumps immunization with 2 doses of MMR vaccine or rubella immunization with 1 dose of MMR vaccine.  Pneumococcal 13-valent conjugate (PCV13) vaccine. When indicated, a person who is uncertain of her immunization history and has no record of immunization should receive the PCV13 vaccine. An adult aged 19 years or older who has certain medical conditions and has not been previously immunized should receive 1 dose of PCV13 vaccine. This PCV13 should be followed with a dose of pneumococcal polysaccharide (PPSV23) vaccine. The PPSV23 vaccine dose should be obtained at least 8 weeks after the dose of PCV13 vaccine. An adult aged 19  years or older who has certain medical conditions and previously received 1 or more doses of PPSV23 vaccine should receive 1 dose of PCV13. The PCV13 vaccine dose should be obtained 1 or more years after the last PPSV23 vaccine dose.  Pneumococcal polysaccharide (PPSV23) vaccine. When PCV13 is also indicated, PCV13 should be obtained first. All adults aged 65 years and older should be immunized. An adult younger than age 65 years who has certain medical conditions should be immunized. Any person who resides in a nursing home or long-term care facility should be immunized. An adult smoker should be immunized. People with an immunocompromised condition and certain other conditions should receive both PCV13 and PPSV23 vaccines. People with human immunodeficiency virus (HIV) infection should be immunized as soon as possible after diagnosis. Immunization during chemotherapy or radiation therapy should be avoided. Routine use of PPSV23 vaccine is not recommended for American Indians, [place] Natives, or people younger than 65 years unless there are medical conditions that require PPSV23 vaccine. When indicated, people who have unknown immunization and have no record of immunization should receive PPSV23 vaccine. One-time revaccination 5 years after the first dose of PPSV23 is recommended for people aged 19-64 years who have chronic kidney failure, nephrotic syndrome, asplenia, or immunocompromised conditions. People who received 1-2 doses of PPSV23 before age 65 years should receive another dose of PPSV23 vaccine at age 65 years or later if at least 5 years have passed since the previous dose. Doses of PPSV23 are not needed for people immunized with PPSV23 at or after age 65 years.  Meningococcal vaccine. Adults with asplenia or persistent complement component deficiencies should receive 2 doses of quadrivalent meningococcal conjugate (MenACWY-D) vaccine. The doses should be obtained at least 2 months apart.  Microbiologists working with certain meningococcal bacteria, military recruits, people at risk during an outbreak, and people who travel to or live in countries with a high rate of meningitis should be immunized. A first-year college student up through age   21 years who is living in a residence hall should receive a dose if she did not receive a dose on or after her 16th birthday. Adults who have certain high-risk conditions should receive one or more doses of vaccine.  Hepatitis A vaccine. Adults who wish to be protected from this disease, have certain high-risk conditions, work with hepatitis A-infected animals, work in hepatitis A research labs, or travel to or work in countries with a high rate of hepatitis A should be immunized. Adults who were previously unvaccinated and who anticipate close contact with an international adoptee during the first 60 days after arrival in the [place] from a country with a high rate of hepatitis A should be immunized.  Hepatitis B vaccine. Adults who wish to be protected from this disease, have certain high-risk conditions, may be exposed to blood or other infectious body fluids, are household contacts or sex partners of hepatitis B positive people, are clients or workers in certain care facilities, or travel to or work in countries with a high rate of hepatitis B should be immunized.  Haemophilus influenzae type b (Hib) vaccine. A previously unvaccinated person with asplenia or sickle cell disease or having a scheduled splenectomy should receive 1 dose of Hib vaccine. Regardless of previous immunization, a recipient of a hematopoietic stem cell transplant should receive a 3-dose series 6-12 months after her successful transplant. Hib vaccine is not recommended for adults with HIV infection. Preventive Services / Frequency Ages 64 to 68 years  Blood pressure check.** / Every 1 to 2 years.  Lipid and cholesterol check.** / Every 5 years beginning at age  22.  Clinical breast exam.** / Every 3 years for women in their 88s and 53s.  BRCA-related cancer risk assessment.** / For women who have family members with a BRCA-related cancer (breast, ovarian, tubal, or peritoneal cancers).  Pap test.** / Every 2 years from ages 90 through 51. Every 3 years starting at age 21 through age 56 or 3 with a history of 3 consecutive normal Pap tests.  HPV screening.** / Every 3 years from ages 24 through ages 1 to 46 with a history of 3 consecutive normal Pap tests.  Hepatitis C blood test.** / For any individual with known risks for hepatitis C.  Skin self-exam. / Monthly.  Influenza vaccine. / Every year.  Tetanus, diphtheria, and acellular pertussis (Tdap, Td) vaccine.** / Consult your health care provider. Pregnant women should receive 1 dose of Tdap vaccine during each pregnancy. 1 dose of Td every 10 years.  Varicella vaccine.** / Consult your health care provider. Pregnant females who do not have evidence of immunity should receive the first dose after pregnancy.  HPV vaccine. / 3 doses over 6 months, if 72 and younger. The vaccine is not recommended for use in pregnant females. However, pregnancy testing is not needed before receiving a dose.  Measles, mumps, rubella (MMR) vaccine.** / You need at least 1 dose of MMR if you were born in 1957 or later. You may also need a 2nd dose. For females of childbearing age, rubella immunity should be determined. If there is no evidence of immunity, females who are not pregnant should be vaccinated. If there is no evidence of immunity, females who are pregnant should delay immunization until after pregnancy.  Pneumococcal 13-valent conjugate (PCV13) vaccine.** / Consult your health care provider.  Pneumococcal polysaccharide (PPSV23) vaccine.** / 1 to 2 doses if you smoke cigarettes or if you have certain conditions.  Meningococcal vaccine.** /  1 dose if you are age 19 to 21 years and a first-year college  student living in a residence hall, or have one of several medical conditions, you need to get vaccinated against meningococcal disease. You may also need additional booster doses.  Hepatitis A vaccine.** / Consult your health care provider.  Hepatitis B vaccine.** / Consult your health care provider.  Haemophilus influenzae type b (Hib) vaccine.** / Consult your health care provider. Ages 40 to 64 years  Blood pressure check.** / Every 1 to 2 years.  Lipid and cholesterol check.** / Every 5 years beginning at age 20 years.  Lung cancer screening. / Every year if you are aged 55-80 years and have a 30-pack-year history of smoking and currently smoke or have quit within the past 15 years. Yearly screening is stopped once you have quit smoking for at least 15 years or develop a health problem that would prevent you from having lung cancer treatment.  Clinical breast exam.** / Every year after age 40 years.  BRCA-related cancer risk assessment.** / For women who have family members with a BRCA-related cancer (breast, ovarian, tubal, or peritoneal cancers).  Mammogram.** / Every year beginning at age 40 years and continuing for as long as you are in good health. Consult with your health care provider.  Pap test.** / Every 3 years starting at age 30 years through age 65 or 70 years with a history of 3 consecutive normal Pap tests.  HPV screening.** / Every 3 years from ages 30 years through ages 65 to 70 years with a history of 3 consecutive normal Pap tests.  Fecal occult blood test (FOBT) of stool. / Every year beginning at age 50 years and continuing until age 75 years. You may not need to do this test if you get a colonoscopy every 10 years.  Flexible sigmoidoscopy or colonoscopy.** / Every 5 years for a flexible sigmoidoscopy or every 10 years for a colonoscopy beginning at age 50 years and continuing until age 75 years.  Hepatitis C blood test.** / For all people born from 1945 through  1965 and any individual with known risks for hepatitis C.  Skin self-exam. / Monthly.  Influenza vaccine. / Every year.  Tetanus, diphtheria, and acellular pertussis (Tdap/Td) vaccine.** / Consult your health care provider. Pregnant women should receive 1 dose of Tdap vaccine during each pregnancy. 1 dose of Td every 10 years.  Varicella vaccine.** / Consult your health care provider. Pregnant females who do not have evidence of immunity should receive the first dose after pregnancy.  Zoster vaccine.** / 1 dose for adults aged 60 years or older.  Measles, mumps, rubella (MMR) vaccine.** / You need at least 1 dose of MMR if you were born in 1957 or later. You may also need a 2nd dose. For females of childbearing age, rubella immunity should be determined. If there is no evidence of immunity, females who are not pregnant should be vaccinated. If there is no evidence of immunity, females who are pregnant should delay immunization until after pregnancy.  Pneumococcal 13-valent conjugate (PCV13) vaccine.** / Consult your health care provider.  Pneumococcal polysaccharide (PPSV23) vaccine.** / 1 to 2 doses if you smoke cigarettes or if you have certain conditions.  Meningococcal vaccine.** / Consult your health care provider.  Hepatitis A vaccine.** / Consult your health care provider.  Hepatitis B vaccine.** / Consult your health care provider.  Haemophilus influenzae type b (Hib) vaccine.** / Consult your health care provider. Ages 65   years and over  Blood pressure check.** / Every 1 to 2 years.  Lipid and cholesterol check.** / Every 5 years beginning at age 22 years.  Lung cancer screening. / Every year if you are aged 73-80 years and have a 30-pack-year history of smoking and currently smoke or have quit within the past 15 years. Yearly screening is stopped once you have quit smoking for at least 15 years or develop a health problem that would prevent you from having lung cancer  treatment.  Clinical breast exam.** / Every year after age 4 years.  BRCA-related cancer risk assessment.** / For women who have family members with a BRCA-related cancer (breast, ovarian, tubal, or peritoneal cancers).  Mammogram.** / Every year beginning at age 40 years and continuing for as long as you are in good health. Consult with your health care provider.  Pap test.** / Every 3 years starting at age 9 years through age 34 or 91 years with 3 consecutive normal Pap tests. Testing can be stopped between 65 and 70 years with 3 consecutive normal Pap tests and no abnormal Pap or HPV tests in the past 10 years.  HPV screening.** / Every 3 years from ages 57 years through ages 64 or 45 years with a history of 3 consecutive normal Pap tests. Testing can be stopped between 65 and 70 years with 3 consecutive normal Pap tests and no abnormal Pap or HPV tests in the past 10 years.  Fecal occult blood test (FOBT) of stool. / Every year beginning at age 15 years and continuing until age 17 years. You may not need to do this test if you get a colonoscopy every 10 years.  Flexible sigmoidoscopy or colonoscopy.** / Every 5 years for a flexible sigmoidoscopy or every 10 years for a colonoscopy beginning at age 86 years and continuing until age 71 years.  Hepatitis C blood test.** / For all people born from [birth year] through 1963 and any individual with known risks for hepatitis C.  Osteoporosis screening.** / A one-time screening for women ages 83 years and over and women at risk for fractures or osteoporosis.  Skin self-exam. / Monthly.  Influenza vaccine. / Every year.  Tetanus, diphtheria, and acellular pertussis (Tdap/Td) vaccine.** / 1 dose of Td every 10 years.  Varicella vaccine.** / Consult your health care provider.  Zoster vaccine.** / 1 dose for adults aged 61 years or older.  Pneumococcal 13-valent conjugate (PCV13) vaccine.** / Consult your health care provider.  Pneumococcal  polysaccharide (PPSV23) vaccine.** / 1 dose for all adults aged 28 years and older.  Meningococcal vaccine.** / Consult your health care provider.  Hepatitis A vaccine.** / Consult your health care provider.  Hepatitis B vaccine.** / Consult your health care provider.  Haemophilus influenzae type b (Hib) vaccine.** / Consult your health care provider. ** Family history and personal history of risk and conditions may change your health care provider's recommendations. Document Released: 10/03/2001 Document Revised: 12/22/2013 Document Reviewed: 01/02/2011 [facility] Patient Information 2015 [place], [place]. This information is not intended to replace advice given to you by your health care provider. Make sure you discuss any questions you have with your health care provider.

## 2014-12-08 NOTE — Progress Notes (Signed)
Subjective:   Kylie Herring is a 79 y.o. female who presents for Medicare Annual (Subsequent) preventive examination.  Review of Systems:   Review of Systems  Constitutional: Negative for activity change, appetite change and fatigue.  HENT: Negative for hearing loss, congestion, tinnitus and ear discharge.   Eyes: Negative for visual disturbance (see optho q1y -- vision corrected to 20/20 with glasses).  Respiratory: Negative for cough, chest tightness and shortness of breath.   Cardiovascular: Negative for chest pain, palpitations and leg swelling.  Gastrointestinal: Negative for abdominal pain, diarrhea, constipation and abdominal distention.  Genitourinary: Negative for urgency, frequency, decreased urine volume and difficulty urinating.  Musculoskeletal: Negative for back pain, arthralgias and gait problem.  Skin: Negative for color change, pallor and rash.  Neurological: Negative for dizziness, light-headedness, numbness and headaches.  Hematological: Negative for adenopathy. Does not bruise/bleed easily.  Psychiatric/Behavioral: Negative for suicidal ideas, confusion, sleep disturbance, self-injury, dysphoric mood, decreased concentration and agitation.  Pt is able to read and write and can do all ADLs No risk for falling No abuse/ violence in home          Objective:     Vitals: BP 126/76 mmHg  Pulse 49  Temp(Src) 97.6 F (36.4 C) (Oral)  Ht 5\' 6"  (1.676 m)  Wt 192 lb (87.091 kg)  BMI 31.00 kg/m2  SpO2 95%  BP 126/76 mmHg  Pulse 49  Temp(Src) 97.6 F (36.4 C) (Oral)  Ht 5\' 6"  (1.676 m)  Wt 192 lb (87.091 kg)  BMI 31.00 kg/m2  SpO2 95% General appearance: alert, cooperative, appears stated age and no distress Head: Normocephalic, without obvious abnormality, atraumatic Eyes: conjunctivae/corneas clear. PERRL, EOM's intact. Fundi benign. Ears: normal TM's and external ear canals both ears Nose: Nares normal. Septum midline. Mucosa normal. No drainage  or sinus tenderness. Throat: lips, mucosa, and tongue normal; teeth and gums normal Neck: no adenopathy, no carotid bruit, no JVD, supple, symmetrical, trachea midline and thyroid not enlarged, symmetric, no tenderness/mass/nodules Back: symmetric, no curvature. ROM normal. No CVA tenderness. Lungs: clear to auscultation bilaterally Breasts: gyn Heart: regular rate and rhythm, S1, S2 normal, no murmur, click, rub or gallop Abdomen: soft, non-tender; bowel sounds normal; no masses,  no organomegaly Pelvic: deferred --- gyn Extremities: extremities normal, atraumatic, no cyanosis or edema Pulses: 2+ and symmetric Skin: Skin color, texture, turgor normal. No rashes or lesions Lymph nodes: Cervical, supraclavicular, and axillary nodes normal. Neurologic: Alert and oriented X 3, normal strength and tone. Normal symmetric reflexes. Normal coordination and gait Psych== no depression, no anxiety Tobacco History  Smoking status  . Never Smoker   Smokeless tobacco  . Not on file     Counseling given: Not Answered   Past Medical History  Diagnosis Date  . Migraines   . Hyperlipidemia    Past Surgical History  Procedure Laterality Date  . Blepharoplasty      brow lift--06-26-19--[facility]   Family History  Problem Relation Age of Onset  . Arthritis    . Diabetes      1 st degree relative  . Hypertension    . Melanoma    . Coronary artery disease      female 1st degree ralative<50  . Heart attack  74    female   History  Sexual Activity  . Sexual Activity:  . Partners: Male    Outpatient Encounter Prescriptions as of 12/08/2014  Medication Sig  . aspirin 81 MG EC tablet Take 81 mg  by mouth daily.    . calcium elemental as carbonate (TUMS ULTRA 1000) 400 MG tablet Chew 1,000 mg by mouth daily.    . Flaxseed, Linseed, 1000 MG CAPS Take 1 capsule by mouth daily.    Marland Kitchen loratadine (CLARITIN) 10 MG tablet Take 10 mg by mouth daily as needed.    . Multiple Vitamin  (MULTIVITAMIN PO) Take by mouth daily.    . solifenacin (VESICARE) 5 MG tablet Take 5 mg by mouth daily.  Marland Kitchen ZOCOR 80 MG tablet Take 1 tablet (80 mg total) by mouth at bedtime.  . [DISCONTINUED] Glucosamine Sulfate 750 MG CAPS Take by mouth.    . [DISCONTINUED] MAXALT 10 MG tablet Take 1 tablet (10 mg total) by mouth as needed for migraine.  . [DISCONTINUED] ZOCOR 80 MG tablet Take 1 tablet (80 mg total) by mouth at bedtime.    Activities of Daily Living In your present state of health, do you have any difficulty performing the following activities: 12/08/2014  Hearing? N  Vision? N  Difficulty concentrating or making decisions? N  Walking or climbing stairs? N  Dressing or bathing? N  Doing errands, shopping? N    Patient Care Team: Rosalita Chessman, DO as PCP - General Cheri Fowler, MD as Consulting Physician (Obstetrics and Gynecology) Sharyne Peach, MD as Consulting Physician (Ophthalmology) Levy Sjogren, MD as Referring Physician (Dermatology)    Assessment:    CPE:  Exercise Activities and Dietary recommendations-- pt rarely exercising    Goals    None     Fall Risk Fall Risk  12/08/2014 11/26/2013 11/20/2012  Falls in the past year? No No No   Depression Screen PHQ 2/9 Scores 12/08/2014 11/26/2013 11/20/2012 11/20/2011  PHQ - 2 Score 0 0 0 0     Cognitive Testing No flowsheet data found.  Immunization History  Administered Date(s) Administered  . H1N1 08/06/2008  . Influenza Split 05/21/2013  . Influenza Whole 05/25/2008, 06/09/2009, 05/31/2012  . Influenza,inj,Quad PF,36+ Mos 06/01/2014  . Pneumococcal Conjugate-13 03/24/2014  . Pneumococcal Polysaccharide-23 06/16/2003  . Td 10/31/2005  . Tdap 11/20/2011  . Zoster 10/30/2005   Screening Tests Health Maintenance  Topic Date Due  . INFLUENZA VACCINE  03/22/2015  . MAMMOGRAM  06/18/2015  . COLONOSCOPY  11/22/2020  . TETANUS/TDAP  11/19/2021  . DEXA SCAN  Completed  . ZOSTAVAX  Completed  . PNA vac  Low Risk Adult  Completed      Plan:     During the course of the visit the patient was educated and counseled about the following appropriate screening and preventive services:   Vaccines to include Pneumoccal, Influenza, Hepatitis B, Td, Zostavax, HCV  Electrocardiogram  Cardiovascular Disease  Colorectal cancer screening  Bone density screening  Diabetes screening  Glaucoma screening  Mammography/PAP  Nutrition counseling   Patient Instructions (the written plan) was given to the patient.   1. Hyperlipidemia Check labs - ZOCOR 80 MG tablet; Take 1 tablet (80 mg total) by mouth at bedtime.  Dispense: 90 tablet; Refill: 3  2. Medicare annual wellness visit, subsequent    Garnet Koyanagi, DO  12/08/2014

## 2014-12-08 NOTE — Telephone Encounter (Signed)
I thought I took out brand med necessary when I sent it but would you [facility]?

## 2014-12-08 NOTE — Telephone Encounter (Signed)
Rx is marked for Brand name, please advise if it is ok to fill the generic.        KP

## 2014-12-10 ENCOUNTER — Encounter: Payer: Self-pay | Admitting: Family Medicine

## 2014-12-10 DIAGNOSIS — E785 Hyperlipidemia, unspecified: Secondary | ICD-10-CM

## 2014-12-10 MED ORDER — SIMVASTATIN 80 MG PO TABS: 80.0000 mg | ORAL_TABLET | Freq: Every day | ORAL | Status: AC

## 2015-01-13 ENCOUNTER — Ambulatory Visit (INDEPENDENT_AMBULATORY_CARE_PROVIDER_SITE_OTHER): Payer: Medicare Other | Admitting: *Deleted

## 2015-01-13 DIAGNOSIS — Z111 Encounter for screening for respiratory tuberculosis: Secondary | ICD-10-CM | POA: Diagnosis not present

## 2015-01-13 NOTE — Progress Notes (Signed)
Pre visit review using our clinic review tool, if applicable. No additional management support is needed unless otherwise documented below in the visit note.  Patient tolerated injection well.  Patient notified to return after 9:15 on Friday.

## 2015-01-15 ENCOUNTER — Telehealth: Payer: Self-pay | Admitting: *Deleted

## 2015-01-15 LAB — TB SKIN TEST
Induration: 0 mm
TB Skin Test: NEGATIVE

## 2015-01-15 NOTE — Telephone Encounter (Signed)
Pt dropped off paperwork to be filled out for [employer]. Forms filled out as much as possible and forwarded to Dr. Etter Sjogren.

## 2015-01-15 NOTE — Telephone Encounter (Signed)
Completed paperwork mailed to [facility] in envelope provided. Copy sent for scanning. JG//CMA

## 2015-06-07 ENCOUNTER — Telehealth: Payer: Self-pay | Admitting: Family Medicine

## 2015-06-07 DIAGNOSIS — E785 Hyperlipidemia, unspecified: Secondary | ICD-10-CM

## 2015-06-07 NOTE — Telephone Encounter (Signed)
Please advise      KP 

## 2015-06-07 NOTE — Telephone Encounter (Signed)
Pt called to schedule labs. Per AVS 12/08/14 I advised pt to schedule appt with Dr. Etter Sjogren and labs possibly following. She said she doesn't normally see the doctor, only lab work. Please advise if she is to have only lab or to see Dr. Etter Sjogren.

## 2015-06-07 NOTE — Telephone Encounter (Signed)
She can just have labs done

## 2015-06-08 NOTE — Telephone Encounter (Signed)
Please schedule a lab only apt. The orders are in.       [place]

## 2015-06-08 NOTE — Telephone Encounter (Signed)
Scheduled for 06/16/15.

## 2015-06-16 ENCOUNTER — Telehealth: Payer: Self-pay | Admitting: Family Medicine

## 2015-06-16 ENCOUNTER — Other Ambulatory Visit (INDEPENDENT_AMBULATORY_CARE_PROVIDER_SITE_OTHER): Payer: Medicare Other

## 2015-06-16 DIAGNOSIS — E785 Hyperlipidemia, unspecified: Secondary | ICD-10-CM

## 2015-06-16 LAB — HEPATIC FUNCTION PANEL
ALBUMIN: 4 g/dL (ref 3.5–5.2)
ALK PHOS: 79 U/L (ref 39–117)
ALT: 16 U/L (ref 0–35)
AST: 20 U/L (ref 0–37)
BILIRUBIN DIRECT: 0.1 mg/dL (ref 0.0–0.3)
BILIRUBIN TOTAL: 0.6 mg/dL (ref 0.2–1.2)
Total Protein: 6.9 g/dL (ref 6.0–8.3)

## 2015-06-16 LAB — LIPID PANEL
Cholesterol: 156 mg/dL (ref 0–200)
HDL: 63.7 mg/dL (ref >39.00)
LDL Cholesterol: 76 mg/dL (ref 0–99)
NONHDL: 92.31
Total CHOL/HDL Ratio: 2
Triglycerides: 82 mg/dL (ref 0.0–149.0)
VLDL: 16.4 mg/dL (ref 0.0–40.0)

## 2015-06-16 NOTE — Telephone Encounter (Signed)
The chart has been updated.     KP 

## 2015-06-16 NOTE — Telephone Encounter (Signed)
Caller name: Evelyne  Relation to pt: self Call back number: [phone] Pharmacy:  Reason for call: Pt came in office to inform had her flu shot done on Jun 07, 2015 at [facility]. Pt states does not need to set up appt for flu shot with [place].

## 2015-07-31 ENCOUNTER — Encounter: Payer: Self-pay | Admitting: Family Medicine

## 2015-08-02 ENCOUNTER — Other Ambulatory Visit: Payer: Self-pay | Admitting: Family Medicine

## 2015-08-02 MED ORDER — CIPROFLOXACIN HCL 500 MG PO TABS: 500.0000 mg | ORAL_TABLET | Freq: Two times a day (BID) | ORAL | Status: AC

## 2015-08-02 NOTE — Telephone Encounter (Signed)
The patient was last seen 12/08/14. Please advise      KP

## 2015-08-09 IMAGING — MG MM DIGITAL SCREENING BILAT
5 series · 5 of 5 positions shown · non-contrast
Comparison: Previous Exam(s)

ACR Breast Density Category b.  Scattered fibroglandular tissue

CLINICAL DATA: Screening.

EXAM:
DIGITAL SCREENING BILATERAL MAMMOGRAM WITH CAD

[L CC]
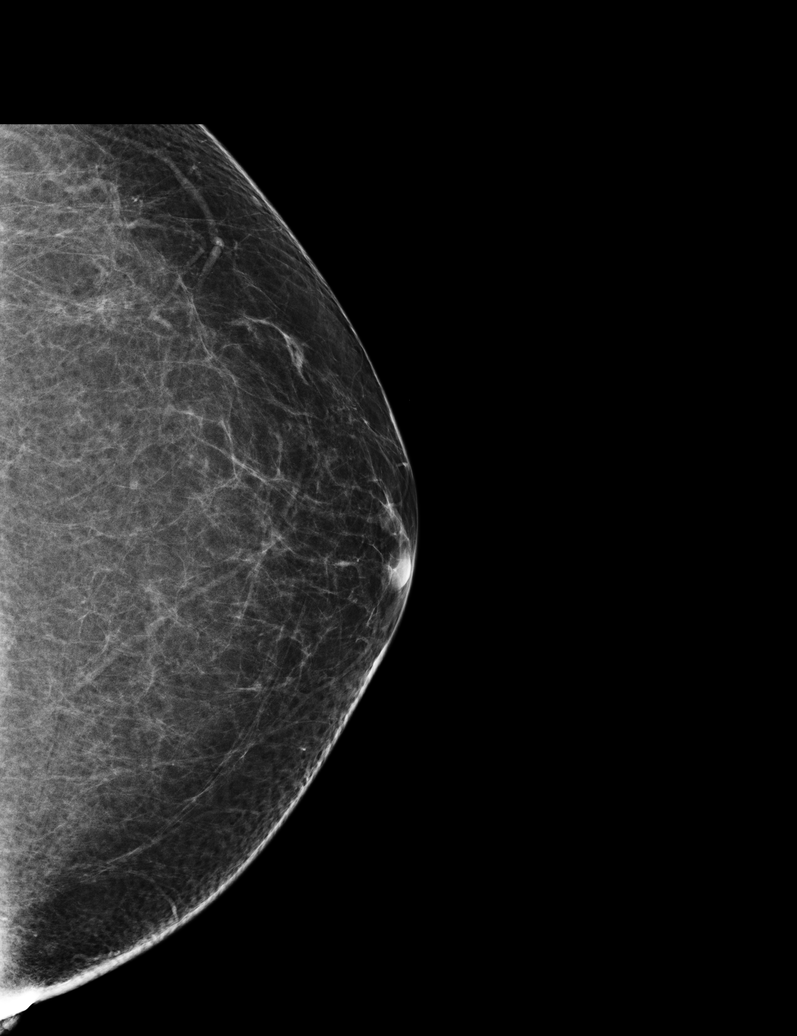

[R MLO]
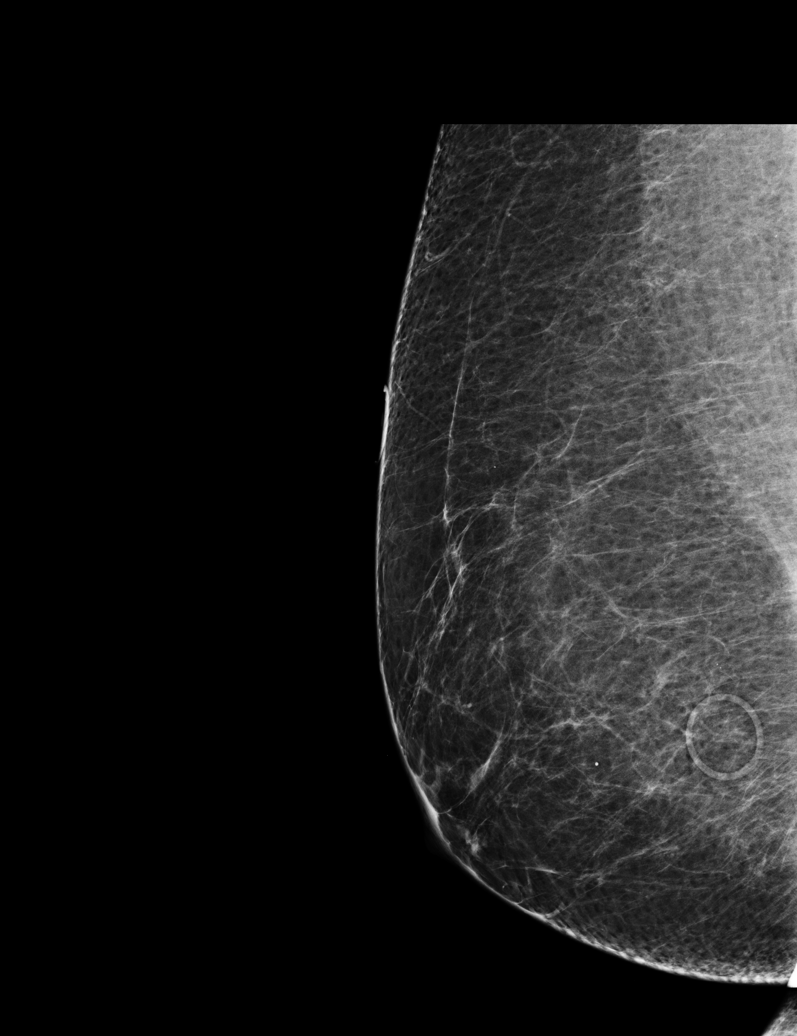

[R CC]
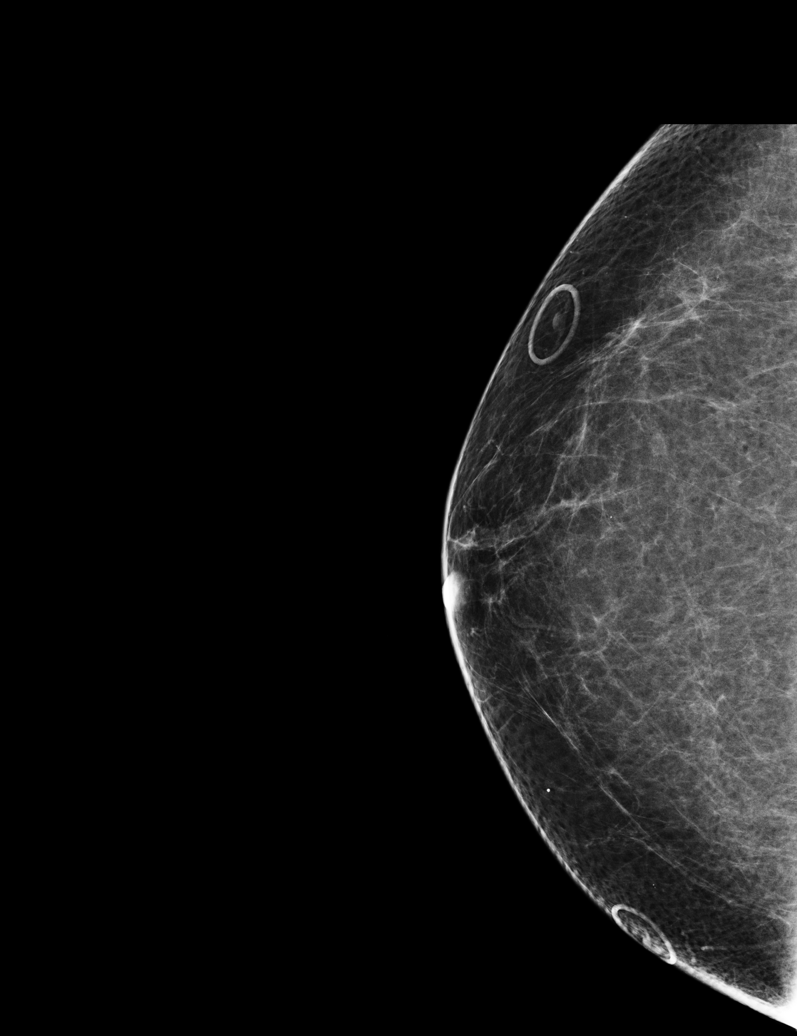

[L XCCL]
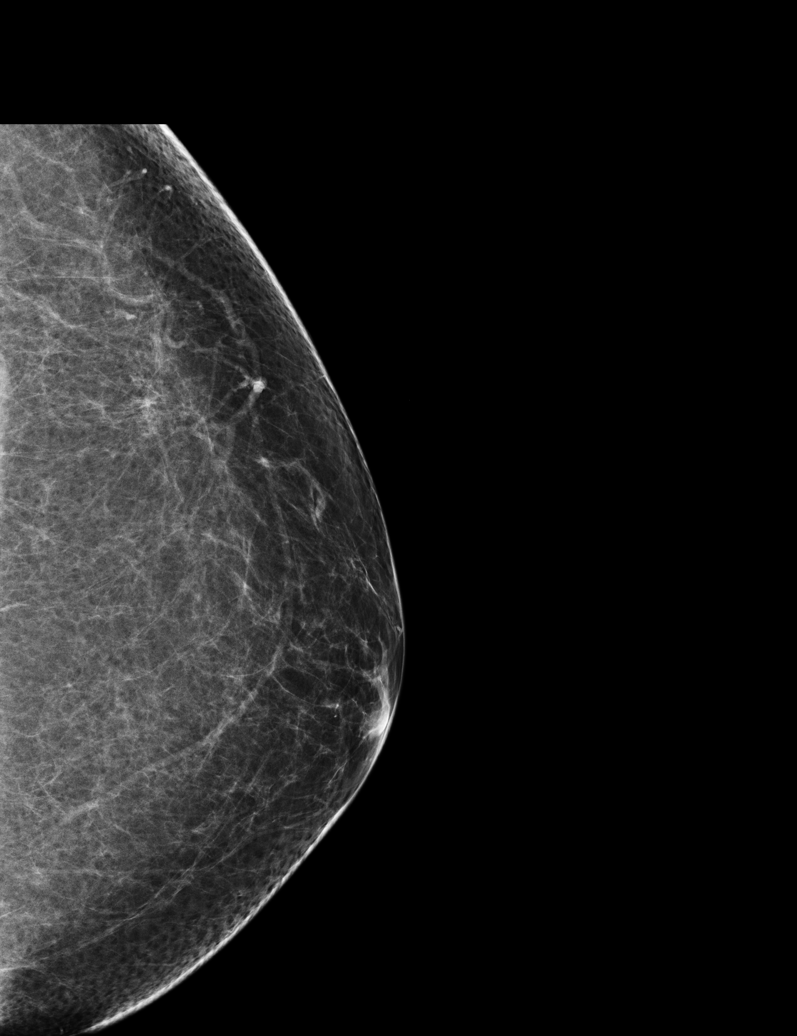

[L MLO]
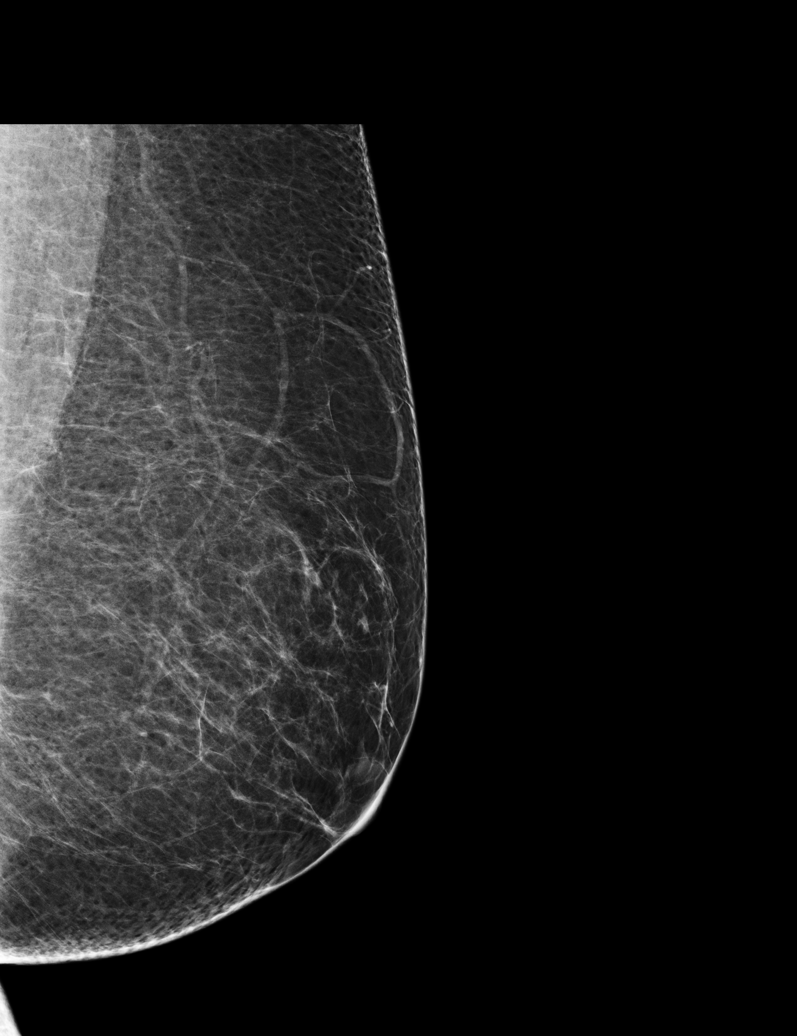

[5 of 5 positions shown; findings below may reference images not displayed]

FINDINGS: In the left breast, possible distortion warrants further evaluation
with spot compression views and possibly ultrasound. In the right
breast, no suspicious masses or malignant type calcifications are
identified. Images were processed with CAD.
IMPRESSION: Further evaluation is suggested for possible distortion in the left
breast.

RECOMMENDATION:
Diagnostic mammogram and possibly ultrasound of the left breast.
(Code:QN-[id number])

The patient will be contacted regarding the findings, and additional
imaging will be scheduled.

BI-RADS CATEGORY  0: Incomplete. Need additional imaging evaluation
and/or prior mammograms for comparison.

## 2015-08-11 ENCOUNTER — Encounter: Payer: Self-pay | Admitting: Family Medicine

## 2015-12-10 ENCOUNTER — Encounter: Payer: Self-pay | Admitting: Family Medicine

## 2015-12-17 ENCOUNTER — Ambulatory Visit: Payer: Medicare Other
